# Patient Record
Sex: Female | Born: 1995 | Race: Black or African American | Hispanic: No | Marital: Single | Smoking: Current every day smoker
Health system: Southern US, Community
[De-identification: ages and names within clinical notes are randomized; demographics above are authoritative.]

## PROBLEM LIST (undated history)

## (undated) DIAGNOSIS — K219 Gastro-esophageal reflux disease without esophagitis: Secondary | ICD-10-CM

## (undated) DIAGNOSIS — K2970 Gastritis, unspecified, without bleeding: Secondary | ICD-10-CM

---
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
## 2013-01-01 ENCOUNTER — Emergency Department (HOSPITAL_COMMUNITY)
Admission: EM | Admit: 2013-01-01 | Discharge: 2013-01-01 | Disposition: A | Discharge status: Home / Self Care | Payer: BC Managed Care – PPO | Attending: Emergency Medicine | Admitting: Emergency Medicine

## 2013-01-01 ENCOUNTER — Emergency Department (HOSPITAL_COMMUNITY): Payer: BC Managed Care – PPO

## 2013-01-01 ENCOUNTER — Encounter (HOSPITAL_COMMUNITY): Payer: Self-pay | Admitting: Emergency Medicine

## 2013-01-01 DIAGNOSIS — K219 Gastro-esophageal reflux disease without esophagitis: Secondary | ICD-10-CM | POA: Insufficient documentation

## 2013-01-01 DIAGNOSIS — N83209 Unspecified ovarian cyst, unspecified side: Secondary | ICD-10-CM | POA: Insufficient documentation

## 2013-01-01 DIAGNOSIS — Z79899 Other long term (current) drug therapy: Secondary | ICD-10-CM | POA: Insufficient documentation

## 2013-01-01 DIAGNOSIS — K5900 Constipation, unspecified: Secondary | ICD-10-CM | POA: Insufficient documentation

## 2013-01-01 LAB — CBC WITH DIFFERENTIAL/PLATELET
Basophils Relative: 0 % (ref 0–1)
Eosinophils Absolute: 0 10*3/uL (ref 0.0–0.7)
Eosinophils Relative: 0 % (ref 0–5)
HCT: 42.1 % (ref 36.0–46.0)
Hemoglobin: 14.3 g/dL (ref 12.0–15.0)
Lymphocytes Relative: 14 % (ref 12–46)
Lymphs Abs: 1.2 10*3/uL (ref 0.7–4.0)
Monocytes Absolute: 0.5 10*3/uL (ref 0.1–1.0)
Monocytes Relative: 6 % (ref 3–12)
Neutro Abs: 7 10*3/uL (ref 1.7–7.7)
WBC: 8.8 10*3/uL (ref 4.0–10.5)

## 2013-01-01 LAB — POCT I-STAT, CHEM 8
BUN: 4 mg/dL — ABNORMAL LOW (ref 6–23)
Calcium, Ion: 1.07 mmol/L — ABNORMAL LOW (ref 1.12–1.23)
Creatinine, Ser: 0.7 mg/dL (ref 0.50–1.10)
HCT: 45 % (ref 36.0–46.0)
Hemoglobin: 15.3 g/dL — ABNORMAL HIGH (ref 12.0–15.0)
Potassium: 3.4 mEq/L — ABNORMAL LOW (ref 3.5–5.1)
Sodium: 135 mEq/L (ref 135–145)
TCO2: 22 mmol/L (ref 0–100)

## 2013-01-01 MED ORDER — IOHEXOL 300 MG/ML  SOLN
80.0000 mL | Freq: Once | INTRAMUSCULAR | Status: AC | PRN
  Administered 2013-01-01: 80 mL via INTRAVENOUS

## 2013-01-01 MED ORDER — GI COCKTAIL ~~LOC~~
30.0000 mL | Freq: Once | ORAL | Status: AC
  Administered 2013-01-01: 30 mL via ORAL
  Filled 2013-01-01: qty 30

## 2013-01-01 MED ORDER — ONDANSETRON HCL 4 MG/2ML IJ SOLN
4.0000 mg | Freq: Once | INTRAMUSCULAR | Status: AC
  Administered 2013-01-01: 4 mg via INTRAVENOUS

## 2013-01-01 MED ORDER — KETOROLAC TROMETHAMINE 30 MG/ML IJ SOLN
30.0000 mg | Freq: Once | INTRAMUSCULAR | Status: AC
  Administered 2013-01-01: 30 mg via INTRAVENOUS
  Filled 2013-01-01: qty 1

## 2013-01-01 MED ORDER — IOHEXOL 300 MG/ML  SOLN
50.0000 mL | Freq: Once | INTRAMUSCULAR | Status: AC | PRN
  Administered 2013-01-01: 50 mL via ORAL

## 2013-01-01 MED ORDER — SODIUM CHLORIDE 0.9 % IV SOLN
Freq: Once | INTRAVENOUS | Status: AC
  Administered 2013-01-01: 06:00:00 via INTRAVENOUS

## 2013-01-01 MED ORDER — ONDANSETRON HCL 4 MG/2ML IJ SOLN
INTRAMUSCULAR | Status: AC
  Filled 2013-01-01: qty 2

## 2013-01-01 NOTE — ED Provider Notes (Signed)
CSN: [id number]     Arrival date & time 01/01/13  0436 History   First MD Initiated Contact with Patient 01/01/13 0518     Chief Complaint  Patient presents with  . Abdominal Pain   (Consider location/radiation/quality/duration/timing/severity/associated sxs/prior Treatment) HPI Comments: This is the 3rd ED visit for this patient with the same complaint of epigastric, pain.  She's been evaluated twice with labs, which were, normal.  She's been treated for gastritis started on Protonix and Vicodin, with no relief of her discomfort.  Now.  She states she is constipated, as well.  Has not tried any treatment for her constipation She states she just can't get comfortable and rest.  Denies fever, dysuria, vaginal discharge, vomiting  Patient is a 17 y.o. female presenting with abdominal pain. The history is provided by the patient.  Abdominal Pain Pain location:  Epigastric Pain quality: aching and gnawing   Pain radiates to:  Does not radiate Pain severity:  Severe Onset quality:  Unable to specify Duration:  4 days Timing:  Constant Chronicity:  New Associated symptoms: constipation   Associated symptoms: no chest pain, no chills, no diarrhea, no dysuria, no fever, no nausea, no shortness of breath and no vomiting     History reviewed. No pertinent past medical history. History reviewed. No pertinent past surgical history. History reviewed. No pertinent family history. History  Substance Use Topics  . Smoking status: Never Smoker   . Smokeless tobacco: Not on file  . Alcohol Use: No   OB History   Grav Para Term Preterm Abortions TAB SAB Ect Mult Living                 Review of Systems  Constitutional: Negative for fever and chills.  Respiratory: Negative for shortness of breath.   Cardiovascular: Negative for chest pain.  Gastrointestinal: Positive for abdominal pain and constipation. Negative for nausea, vomiting and diarrhea.  Genitourinary: Negative for dysuria.  Skin:  Negative for rash.  All other systems reviewed and are negative.    Allergies  Review of patient's allergies indicates no known allergies.  Home Medications   Current Outpatient Rx  Name  Route  Sig  Dispense  Refill  . HYDROcodone-acetaminophen (NORCO/VICODIN) 5-325 MG per tablet   Oral   Take 1-2 tablets by mouth every 6 (six) hours as needed for severe pain.   6 tablet   0   . ondansetron (ZOFRAN-ODT) 4 MG disintegrating tablet   Oral   Take 4 mg by mouth every 8 (eight) hours as needed for nausea or vomiting.         . pantoprazole (PROTONIX) 20 MG tablet   Oral   Take 1 tablet (20 mg total) by mouth daily.   10 tablet   0   . naproxen sodium (ANAPROX) 220 MG tablet   Oral   Take 220-440 mg by mouth 2 (two) times daily as needed (for pain).          BP 131/90  Pulse 91  Temp(Src) 98.8 F (37.1 C) (Oral)  Resp 18  Ht 5\' 4"  (1.626 m)  Wt 107 lb 9.6 oz (48.807 kg)  BMI 18.46 kg/m2  SpO2 100%  LMP 12/13/2012 Physical Exam  Nursing note and vitals reviewed. Constitutional: She is oriented to person, place, and time. She appears well-developed and well-nourished.  HENT:  Head: Normocephalic.  Eyes: Pupils are equal, round, and reactive to light.  Neck: Normal range of motion.  Cardiovascular: Normal rate and regular  rhythm.   Pulmonary/Chest: Effort normal.  Abdominal: Soft. Bowel sounds are normal. She exhibits no distension. There is no splenomegaly or hepatomegaly. There is tenderness in the epigastric area. There is no rigidity.  Musculoskeletal: Normal range of motion.  Neurological: She is alert and oriented to person, place, and time.  Skin: Skin is warm. No rash noted.    ED Course  Procedures (including critical care time) Labs Review Labs Reviewed  CBC WITH DIFFERENTIAL   Imaging Review No results found.  EKG Interpretation   None       MDM  No diagnosis found.      Arman Filter, NP 01/01/13 [phone]

## 2013-01-01 NOTE — ED Provider Notes (Signed)
Medical screening examination/treatment/procedure(s) were performed by non-physician practitioner and as supervising physician I was immediately available for consultation/collaboration.  EKG Interpretation   None         James Lafalce L Shylo Dillenbeck, MD 01/01/13 0607 

## 2013-01-01 NOTE — ED Notes (Signed)
Pt arrived to the Ed with a complaint of abdominal pain.  Pt has been seen for this two previous times but has not gotten relief from the pain.  Pt states her last bowel movement was this past Sunday and that it was not large.  Pt states pain is located at the top of her stomach.  Pt states that the pain is tight balling sensation.

## 2013-01-01 NOTE — ED Notes (Addendum)
Attempting to get vital signs for discharge and mother of patient demanding to talk to doctors because is not in agreeance of PA diagnosis. PA Robyn notified; MD Silverio Lay at bedside now. RN Ajsa aware. Charge RN [employer] aware as well.

## 2013-01-01 NOTE — ED Provider Notes (Signed)
Care assumed from [place], NP at shift change. Pt with epigastric pain, seen twice (12/21 and 12/23) and diagnosed with gastritis. CT abdomen pending.  8:31 AM CT scan showing evidence of recent ruptured ovarian cyst. Kayla Hubbard questioning why she continues to burp and have epigastric pain. I advised outpatient GI followup. Denies vaginal bleeding or discharge. Advised followup with gynecology as well regarding ovarian cyst. Return precautions given. Kayla Hubbard states understanding of treatment care plan and is agreeable.  US Abdomen Complete  12/29/2012   CLINICAL DATA:  Abdominal pain  EXAM: ULTRASOUND ABDOMEN COMPLETE  COMPARISON:  None.  FINDINGS: Gallbladder:  No gallstones or wall thickening visualized. No sonographic Murphy sign noted.  Common bile duct:  Diameter: 3 mm in caliber.  Liver:  No focal lesion identified. Within normal limits in parenchymal echogenicity.  IVC:  No abnormality visualized.  Pancreas:  Visualized portion unremarkable.  Spleen:  Size and appearance within normal limits.  Right Kidney:  Length: 10.1 cm in length. Echogenicity within normal limits. No mass or hydronephrosis visualized.  Left Kidney:  Length: 10.6 cm in length. Echogenicity within normal limits. No mass or hydronephrosis visualized.  Abdominal aorta:  No aneurysm visualized.  Other findings:  None.  IMPRESSION: No acute intra-abdominal pathology.   Electronically Signed   By: Maryclare Bean M.D.   On: 12/29/2012 13:03   Ct Abdomen Pelvis W Contrast  01/01/2013   CLINICAL DATA:  Abdominal pain  EXAM: CT ABDOMEN AND PELVIS WITH CONTRAST  TECHNIQUE: Multidetector CT imaging of the abdomen and pelvis was performed using the standard protocol following bolus administration of intravenous contrast. Oral contrast was also administered.  CONTRAST:  80mL OMNIPAQUE IOHEXOL 300 MG/ML  SOLN  COMPARISON:  Abdominal ultrasound December 29, 2012  FINDINGS: Lung bases are clear.  No focal liver lesions are identified. There is no  biliary duct dilatation. Gallbladder wall is not thickened.  Spleen, pancreas, and adrenals appear normal. Kidneys bilaterally show no appreciable mass or hydronephrosis on either side. There is no renal or ureteral calculus on either side.  In the pelvis, there is a small amount of free fluid in the cul-de-sac. There is also some fluid immediately adjacent to the right ovary. There is mild enhancement of the wall of the a 1.7 x 1.3 cm right ovarian cyst. There is no other evidence of pelvic mass. Urinary bladder is midline with normal wall thickness.  Appendix appears normal.  There is no bowel obstruction.  No free air or portal venous air.  There is no adenopathy or abscess in the abdomen or pelvis. Aorta is nonaneurysmal. There are no blastic or lytic bone lesions. The right L1 lamina is hypoplastic, an anatomic variant.  IMPRESSION: There is fluid tracking from the right ovary to the cul-de-sac. Suspect recent ovarian cyst rupture. Slight enhancement of the wall of the remaining right ovarian cyst is supportive of this etiology.  Appendix appears normal. No abscess. No bowel obstruction. No renal or ureteral calculus. No hydronephrosis.   Electronically Signed   By: Bretta Bang M.D.   On: 01/01/2013 08:12    Trevor Mace, PA-C 01/01/13 [phone]

## 2013-01-04 ENCOUNTER — Telehealth: Payer: Self-pay | Admitting: Gastroenterology

## 2013-08-03 ENCOUNTER — Other Ambulatory Visit: Payer: Self-pay | Admitting: Gastroenterology

## 2013-08-03 DIAGNOSIS — R1013 Epigastric pain: Secondary | ICD-10-CM

## 2013-08-11 ENCOUNTER — Ambulatory Visit (HOSPITAL_COMMUNITY): Admission: RE | Admit: 2013-08-11 | Payer: BC Managed Care – PPO | Source: Ambulatory Visit

## 2013-09-12 ENCOUNTER — Emergency Department (HOSPITAL_COMMUNITY)
Admission: EM | Admit: 2013-09-12 | Discharge: 2013-09-12 | Disposition: A | Discharge status: Home / Self Care | Payer: BC Managed Care – HMO | Attending: Emergency Medicine | Admitting: Emergency Medicine

## 2013-09-12 ENCOUNTER — Encounter (HOSPITAL_COMMUNITY): Payer: Self-pay | Admitting: Emergency Medicine

## 2013-09-12 DIAGNOSIS — Z3202 Encounter for pregnancy test, result negative: Secondary | ICD-10-CM | POA: Insufficient documentation

## 2013-09-12 DIAGNOSIS — R112 Nausea with vomiting, unspecified: Secondary | ICD-10-CM | POA: Diagnosis not present

## 2013-09-12 DIAGNOSIS — R109 Unspecified abdominal pain: Secondary | ICD-10-CM | POA: Insufficient documentation

## 2013-09-12 DIAGNOSIS — K2970 Gastritis, unspecified, without bleeding: Secondary | ICD-10-CM | POA: Diagnosis not present

## 2013-09-12 DIAGNOSIS — K921 Melena: Secondary | ICD-10-CM | POA: Insufficient documentation

## 2013-09-12 DIAGNOSIS — Z79899 Other long term (current) drug therapy: Secondary | ICD-10-CM | POA: Diagnosis not present

## 2013-09-12 DIAGNOSIS — K2990 Gastroduodenitis, unspecified, without bleeding: Principal | ICD-10-CM

## 2013-09-12 LAB — URINALYSIS, ROUTINE W REFLEX MICROSCOPIC
Bilirubin Urine: NEGATIVE
GLUCOSE, UA: NEGATIVE mg/dL
Ketones, ur: NEGATIVE mg/dL
LEUKOCYTES UA: NEGATIVE
Nitrite: NEGATIVE
Protein, ur: NEGATIVE mg/dL
Specific Gravity, Urine: 1.018 (ref 1.005–1.030)
Urobilinogen, UA: 0.2 mg/dL (ref 0.0–1.0)
pH: 8 (ref 5.0–8.0)

## 2013-09-12 LAB — URINE MICROSCOPIC-ADD ON

## 2013-09-12 LAB — CBC WITH DIFFERENTIAL/PLATELET
Basophils Absolute: 0.1 10*3/uL (ref 0.0–0.1)
Basophils Relative: 0 % (ref 0–1)
Eosinophils Absolute: 0 10*3/uL (ref 0.0–0.7)
Eosinophils Relative: 0 % (ref 0–5)
HCT: 40.6 % (ref 36.0–46.0)
HEMOGLOBIN: 13.6 g/dL (ref 12.0–15.0)
LYMPHS ABS: 1.6 10*3/uL (ref 0.7–4.0)
Lymphocytes Relative: 12 % (ref 12–46)
MCH: 28.9 pg (ref 26.0–34.0)
MCHC: 33.5 g/dL (ref 30.0–36.0)
MCV: 86.4 fL (ref 78.0–100.0)
MONOS PCT: 3 % (ref 3–12)
Monocytes Absolute: 0.4 10*3/uL (ref 0.1–1.0)
NEUTROS ABS: 11.1 10*3/uL — AB (ref 1.7–7.7)
NEUTROS PCT: 85 % — AB (ref 43–77)
Platelets: 310 10*3/uL (ref 150–400)
RBC: 4.7 MIL/uL (ref 3.87–5.11)
RDW: 12.7 % (ref 11.5–15.5)
WBC: 13.1 10*3/uL — AB (ref 4.0–10.5)

## 2013-09-12 LAB — COMPREHENSIVE METABOLIC PANEL
ALT: 17 U/L (ref 0–35)
AST: 25 U/L (ref 0–37)
Albumin: 4.5 g/dL (ref 3.5–5.2)
Alkaline Phosphatase: 47 U/L (ref 39–117)
Anion gap: 15 (ref 5–15)
BILIRUBIN TOTAL: 0.5 mg/dL (ref 0.3–1.2)
BUN: 8 mg/dL (ref 6–23)
CHLORIDE: 99 meq/L (ref 96–112)
CO2: 25 mEq/L (ref 19–32)
Calcium: 9.6 mg/dL (ref 8.4–10.5)
Creatinine, Ser: 0.65 mg/dL (ref 0.50–1.10)
GFR calc Af Amer: >90 mL/min (ref >90)
GFR calc non Af Amer: >90 mL/min (ref >90)
GLUCOSE: 121 mg/dL — AB (ref 70–99)
POTASSIUM: 3.8 meq/L (ref 3.7–5.3)
Sodium: 139 mEq/L (ref 137–147)
TOTAL PROTEIN: 8.1 g/dL (ref 6.0–8.3)

## 2013-09-12 LAB — POC URINE PREG, ED: Preg Test, Ur: NEGATIVE

## 2013-09-12 LAB — LIPASE, BLOOD: Lipase: 25 U/L (ref 11–59)

## 2013-09-12 MED ORDER — ONDANSETRON 4 MG PO TBDP: 4.0000 mg | ORAL_TABLET | Freq: Three times a day (TID) | ORAL | Status: DC | PRN

## 2013-09-12 MED ORDER — FAMOTIDINE 20 MG PO TABS
40.0000 mg | ORAL_TABLET | Freq: Once | ORAL | Status: DC
  Filled 2013-09-12: qty 2

## 2013-09-12 MED ORDER — PANTOPRAZOLE SODIUM 40 MG PO TBEC
40.0000 mg | DELAYED_RELEASE_TABLET | Freq: Once | ORAL | Status: DC
  Filled 2013-09-12: qty 1

## 2013-09-12 MED ORDER — ONDANSETRON HCL 4 MG/2ML IJ SOLN
4.0000 mg | Freq: Once | INTRAMUSCULAR | Status: AC
  Administered 2013-09-12: 4 mg via INTRAVENOUS
  Filled 2013-09-12: qty 2

## 2013-09-12 MED ORDER — PANTOPRAZOLE SODIUM 40 MG IV SOLR
40.0000 mg | Freq: Once | INTRAVENOUS | Status: AC
  Administered 2013-09-12: 40 mg via INTRAVENOUS
  Filled 2013-09-12: qty 40

## 2013-09-12 MED ORDER — FAMOTIDINE IN NACL 20-0.9 MG/50ML-% IV SOLN
20.0000 mg | Freq: Once | INTRAVENOUS | Status: AC
  Administered 2013-09-12: 20 mg via INTRAVENOUS
  Filled 2013-09-12: qty 50

## 2013-09-12 MED ORDER — SODIUM CHLORIDE 0.9 % IV BOLUS (SEPSIS)
1000.0000 mL | Freq: Once | INTRAVENOUS | Status: AC
  Administered 2013-09-12: 1000 mL via INTRAVENOUS

## 2013-09-12 MED ORDER — HYDROMORPHONE HCL PF 1 MG/ML IJ SOLN
1.0000 mg | Freq: Once | INTRAMUSCULAR | Status: AC
  Administered 2013-09-12: 1 mg via INTRAVENOUS
  Filled 2013-09-12: qty 1

## 2013-09-12 MED ORDER — FAMOTIDINE 20 MG PO TABS: 20.0000 mg | ORAL_TABLET | Freq: Two times a day (BID) | ORAL | Status: DC

## 2013-09-12 NOTE — ED Notes (Signed)
Pt states that she drank too much last night and has been vomiting since this morning.  States that she saw BRB in her stool this morning.  Pt vomiting in triage.

## 2013-09-12 NOTE — Discharge Instructions (Signed)
Please read and follow all provided instructions.  Your diagnoses today include:  1. Gastritis   2. Non-intractable vomiting with nausea, vomiting of unspecified type     Tests performed today include:  Blood counts and electrolytes  Blood tests to check liver and kidney function  Blood tests to check pancreas function  Urine test to look for infection and pregnancy (in women)  Vital signs. See below for your results today.   Medications prescribed:   Pepcid (famotidine) - antihistamine  You can find this medication over-the-counter.   DO NOT exceed:    Pepcid every 12 hours   Zofran (ondansetron) - for nausea and vomiting  Take any prescribed medications only as directed.  Home care instructions:   Follow any educational materials contained in this packet.   Your abdominal pain, nausea, vomiting may be caused by gastritis. You should rest for the next several days. Keep drinking plenty of fluids and use the medicine for nausea as directed.    Drink clear liquids for the next 24 hours and introduce solid foods slowly after 24 hours using the b.r.a.t. diet (Bananas, Rice, Applesauce, Toast, Yogurt).    Follow-up instructions: Please follow-up with your primary care provider in the next 2 days for further evaluation of your symptoms. If you are not feeling better in 48 hours you may have a condition that is more serious and you need re-evaluation.   Return instructions:  SEEK IMMEDIATE MEDICAL ATTENTION IF:  If you have pain that does not go away or becomes severe   A temperature above 101F develops   Repeated vomiting occurs (multiple episodes)   If you have pain that becomes localized to portions of the abdomen. The right side could possibly be appendicitis. In an adult, the left lower portion of the abdomen could be colitis or diverticulitis.   Blood is being passed in stools or vomit (bright red or black tarry stools)   You develop chest pain,  difficulty breathing, dizziness or fainting, or become confused, poorly responsive, or inconsolable (young children)  If you have any other emergent concerns regarding your health  Additional Information: Abdominal (belly) pain can be caused by many things. Your caregiver performed an examination and possibly ordered blood/urine tests and imaging (CT scan, x-rays, ultrasound). Many cases can be observed and treated at home after initial evaluation in the emergency department. Even though you are being discharged home, abdominal pain can be unpredictable. Therefore, you need a repeated exam if your pain does not resolve, returns, or worsens. Most patients with abdominal pain don't have to be admitted to the hospital or have surgery, but serious problems like appendicitis and gallbladder attacks can start out as nonspecific pain. Many abdominal conditions cannot be diagnosed in one visit, so follow-up evaluations are very important.  Your vital signs today were: BP 119/72   Pulse 96   Temp(Src) 97.9 F (36.6 C) (Oral)   Resp 18   Wt 112 lb (50.803 kg)   SpO2 100%   LMP 09/05/2013 If your blood pressure (bp) was elevated above 135/85 this visit, please have this repeated by your doctor within one month. --------------

## 2013-09-12 NOTE — ED Provider Notes (Signed)
CSN: [id number]     Arrival date & time 09/12/13  1427 History   First MD Initiated Contact with Patient 09/12/13 1534     Chief Complaint  Patient presents with  . Abdominal Pain  . Nausea  . Emesis     (Consider location/radiation/quality/duration/timing/severity/associated sxs/prior Treatment) HPI Comments: Patient with no past surgical history presents with complaint of upper abdominal pain, nausea, vomiting since 2 AM this morning. Pt also states that she had a BM with bright red blood this morning. She has had this before but denies history of hemorrhoids. Patient was drinking alcohol heavily and thinks this caused her symptoms. No fever, CP, SOB, hematuria or dysuria. No easily bruising or bleeding. No blood thinners. Occasional use of naproxen but not in excess and not in the past week. No h/o PUD/GERD. Patient was given Mylanta by her mother this AM but vomited shortly afterwards. No other treatments PTA. The onset of this condition was acute. The course is constant. Aggravating factors: none. Alleviating factors: none.    Patient is a 18 y.o. female presenting with abdominal pain and vomiting. The history is provided by the patient.  Abdominal Pain Associated symptoms: nausea and vomiting   Associated symptoms: no chest pain, no constipation, no cough, no diarrhea, no dysuria, no fever and no sore throat   Emesis Associated symptoms: abdominal pain   Associated symptoms: no diarrhea, no headaches, no myalgias and no sore throat     History reviewed. No pertinent past medical history. History reviewed. No pertinent past surgical history. History reviewed. No pertinent family history. History  Substance Use Topics  . Smoking status: Never Smoker   . Smokeless tobacco: Not on file  . Alcohol Use: No   OB History   Grav Para Term Preterm Abortions TAB SAB Ect Mult Living                 Review of Systems  Constitutional: Negative for fever.  HENT: Negative for rhinorrhea  and sore throat.   Eyes: Negative for redness.  Respiratory: Negative for cough.   Cardiovascular: Negative for chest pain.  Gastrointestinal: Positive for nausea, vomiting, abdominal pain and blood in stool. Negative for diarrhea and constipation.  Genitourinary: Negative for dysuria.  Musculoskeletal: Negative for myalgias.  Skin: Negative for rash.  Neurological: Negative for headaches.      Allergies  Review of patient's allergies indicates no known allergies.  Home Medications   Prior to Admission medications   Medication Sig Start Date End Date Taking? Authorizing Provider  Multiple Vitamin (MULTIVITAMIN WITH MINERALS) TABS tablet Take 1 tablet by mouth daily.   Yes Historical Provider, MD  naproxen sodium (ANAPROX) 220 MG tablet Take 220-440 mg by mouth 2 (two) times daily as needed (for pain).   Yes Historical Provider, MD   BP 119/79  Pulse 76  Temp(Src) 97.9 F (36.6 C) (Oral)  Resp 20  Wt 112 lb (50.803 kg)  SpO2 100%  LMP 09/05/2013 Physical Exam  Nursing note and vitals reviewed. Constitutional: She appears well-developed and well-nourished.  HENT:  Head: Normocephalic and atraumatic.  Mouth/Throat: Oropharynx is clear and moist.  No intraoral lesions.   Eyes: Conjunctivae are normal. Right eye exhibits no discharge. Left eye exhibits no discharge.  Neck: Normal range of motion. Neck supple.  Cardiovascular: Normal rate, regular rhythm and normal heart sounds.   No murmur heard. Pulmonary/Chest: Effort normal and breath sounds normal. No respiratory distress. She has no wheezes. She has no rales.  Abdominal:  Soft. Bowel sounds are normal. She exhibits no distension. There is tenderness in the right upper quadrant, epigastric area and left upper quadrant. There is no rebound and no guarding.  Neurological: She is alert.  Skin: Skin is warm and dry.  No bruising.  Psychiatric: She has a normal mood and affect.    ED Course  Procedures (including critical  care time) Labs Review Labs Reviewed  CBC WITH DIFFERENTIAL - Abnormal; Notable for the following:    WBC 13.1 (*)    Neutrophils Relative % 85 (*)    Neutro Abs 11.1 (*)    All other components within normal limits  COMPREHENSIVE METABOLIC PANEL - Abnormal; Notable for the following:    Glucose, Bld 121 (*)    All other components within normal limits  URINALYSIS, ROUTINE W REFLEX MICROSCOPIC - Abnormal; Notable for the following:    APPearance TURBID (*)    Hgb urine dipstick TRACE (*)    All other components within normal limits  LIPASE, BLOOD  URINE MICROSCOPIC-ADD ON  POC URINE PREG, ED    Imaging Review No results found.   EKG Interpretation None      3:54 PM Patient seen and examined. Work-up initiated. Medications ordered.   Vital signs reviewed and are as follows: BP 119/79  Pulse 76  Temp(Src) 97.9 F (36.6 C) (Oral)  Resp 20  Wt 112 lb (50.803 kg)  SpO2 100%  LMP 09/05/2013  8:01 PM Pt feels much better with treatment. She has received 2L NS. Abd is currently soft and non-tender on re-exam. Will d/c to home with symptomatic treatment for gastritis. Patient counseled on clear liquid diet for the next 24 hours and brat diet.  The patient was urged to return to the Emergency Department immediately with worsening of current symptoms, worsening abdominal pain, persistent vomiting, blood noted in stools, fever, or any other concerns. The patient verbalized understanding.    MDM   Final diagnoses:  Gastritis  Non-intractable vomiting with nausea, vomiting of unspecified type   Abd pain, N/V: Patient with symptoms consistent with gastritis, likely 2/2 EtOH use. Vitals are stable, no fever. No signs of severe dehydration, tolerating PO's. Lungs are clear. No focal abdominal pain, no concern for appendicitis, cholecystitis, pancreatitis, ruptured viscus, UTI, kidney stone, or any other abdominal etiology. Supportive therapy indicated with return if symptoms worsen.  Patient counseled.  Blood in stool: bright red suggesting lower GI bleed. Hgb/Hct nml. Pt counseled to monitor. F/u with PCP for recheck if persistent. Likely benign etiology. No further eval indicated here tonight.      Renne Crigler, PA-C 09/12/13 2004

## 2013-09-12 NOTE — ED Notes (Signed)
Patient unable to void 

## 2013-09-12 NOTE — ED Provider Notes (Signed)
Medical screening examination/treatment/procedure(s) were performed by non-physician practitioner and as supervising physician I was immediately available for consultation/collaboration.   EKG Interpretation None      Devoria Albe, MD, Armando Gang   Ward Givens, MD 09/12/13 2012

## 2013-09-12 NOTE — ED Notes (Signed)
Pt unsuccessful in giving UA sample. Will try again

## 2014-01-12 ENCOUNTER — Emergency Department (HOSPITAL_COMMUNITY)
Admission: EM | Admit: 2014-01-12 | Discharge: 2014-01-12 | Disposition: A | Discharge status: Home / Self Care | Payer: BLUE CROSS/BLUE SHIELD | Attending: Emergency Medicine | Admitting: Emergency Medicine

## 2014-01-12 ENCOUNTER — Encounter (HOSPITAL_COMMUNITY): Payer: Self-pay | Admitting: Emergency Medicine

## 2014-01-12 DIAGNOSIS — R1013 Epigastric pain: Secondary | ICD-10-CM | POA: Diagnosis present

## 2014-01-12 DIAGNOSIS — K2970 Gastritis, unspecified, without bleeding: Secondary | ICD-10-CM | POA: Insufficient documentation

## 2014-01-12 HISTORY — DX: Gastritis, unspecified, without bleeding: K29.70

## 2014-01-12 MED ORDER — OMEPRAZOLE 20 MG PO CPDR: 20.0000 mg | DELAYED_RELEASE_CAPSULE | Freq: Every day | ORAL | Status: DC

## 2014-01-12 MED ORDER — ONDANSETRON 4 MG PO TBDP
4.0000 mg | ORAL_TABLET | Freq: Once | ORAL | Status: AC
  Administered 2014-01-12: 4 mg via ORAL
  Filled 2014-01-12: qty 1

## 2014-01-12 MED ORDER — GI COCKTAIL ~~LOC~~
30.0000 mL | Freq: Once | ORAL | Status: AC
  Administered 2014-01-12: 30 mL via ORAL
  Filled 2014-01-12: qty 30

## 2014-01-12 MED ORDER — PANTOPRAZOLE SODIUM 40 MG PO TBEC
40.0000 mg | DELAYED_RELEASE_TABLET | Freq: Once | ORAL | Status: AC
  Administered 2014-01-12: 40 mg via ORAL
  Filled 2014-01-12: qty 1

## 2014-01-12 MED ORDER — PROMETHAZINE HCL 25 MG PO TABS: 25.0000 mg | ORAL_TABLET | Freq: Four times a day (QID) | ORAL | Status: DC | PRN

## 2014-01-12 NOTE — Discharge Instructions (Signed)
Gastritis, Adult Gastritis is soreness and swelling (inflammation) of the lining of the stomach. Gastritis can develop as a sudden onset (acute) or long-term (chronic) condition. If gastritis is not treated, it can lead to stomach bleeding and ulcers. CAUSES  Gastritis occurs when the stomach lining is weak or damaged. Digestive juices from the stomach then inflame the weakened stomach lining. The stomach lining may be weak or damaged due to viral or bacterial infections. One common bacterial infection is the Helicobacter pylori infection. Gastritis can also result from excessive alcohol consumption, taking certain medicines, or having too much acid in the stomach.  SYMPTOMS  In some cases, there are no symptoms. When symptoms are present, they may include:  Pain or a burning sensation in the upper abdomen.  Nausea.  Vomiting.  An uncomfortable feeling of fullness after eating. DIAGNOSIS  Your caregiver may suspect you have gastritis based on your symptoms and a physical exam. To determine the cause of your gastritis, your caregiver may perform the following:  Blood or stool tests to check for the H pylori bacterium.  Gastroscopy. A thin, flexible tube (endoscope) is passed down the esophagus and into the stomach. The endoscope has a light and camera on the end. Your caregiver uses the endoscope to view the inside of the stomach.  Taking a tissue sample (biopsy) from the stomach to examine under a microscope. TREATMENT  Depending on the cause of your gastritis, medicines may be prescribed. If you have a bacterial infection, such as an H pylori infection, antibiotics may be given. If your gastritis is caused by too much acid in the stomach, H2 blockers or antacids may be given. Your caregiver may recommend that you stop taking aspirin, ibuprofen, or other nonsteroidal anti-inflammatory drugs (NSAIDs). HOME CARE INSTRUCTIONS  Only take over-the-counter or prescription medicines as directed by  your caregiver.  If you were given antibiotic medicines, take them as directed. Finish them even if you start to feel better.  Drink enough fluids to keep your urine clear or pale yellow.  Avoid foods and drinks that make your symptoms worse, such as:  Caffeine or alcoholic drinks.  Chocolate.  Peppermint or mint flavorings.  Garlic and onions.  Spicy foods.  Citrus fruits, such as oranges, lemons, or limes.  Tomato-based foods such as sauce, chili, salsa, and pizza.  Fried and fatty foods.  Eat small, frequent meals instead of large meals. SEEK IMMEDIATE MEDICAL CARE IF:   You have black or dark red stools.  You vomit blood or material that looks like coffee grounds.  You are unable to keep fluids down.  Your abdominal pain gets worse.  You have a fever.  You do not feel better after 1 week.  You have any other questions or concerns. MAKE SURE YOU:  Understand these instructions.  Will watch your condition.  Will get help right away if you are not doing well or get worse. Document Released: 12/18/2000 Document Revised: 06/25/2011 Document Reviewed: 02/06/2011 [facility] Patient Information 2015 [place], [place]. This information is not intended to replace advice given to you by your health care provider. Make sure you discuss any questions you have with your health care provider.    Food Choices for Peptic Ulcer Disease, Gastritis When you have peptic ulcer disease, the foods you eat and your eating habits are very important. Choosing the right foods can help ease the discomfort of peptic ulcer disease. WHAT GENERAL GUIDELINES DO I NEED TO FOLLOW?  Choose fruits, vegetables, whole grains, and  low-fat meat, fish, and poultry.   Keep a food diary to identify foods that cause symptoms.  Avoid foods that cause irritation or pain. These may be different for different people.  Eat frequent small meals instead of three large meals each day. The pain may be worse  when your stomach is empty.  Avoid eating close to bedtime. WHAT FOODS ARE NOT RECOMMENDED? The following are some foods and drinks that may worsen your symptoms:  Black, white, and red pepper.  Hot sauce.  Chili peppers.  Chili powder.  Chocolate and cocoa.   Alcohol.  Tea, coffee, and cola (regular and decaffeinated). The items listed above may not be a complete list of foods and beverages to avoid. Contact your dietitian for more information. Document Released: 03/18/2011 Document Revised: 12/29/2012 Document Reviewed: 10/28/2012 [facility] Patient Information 2015 [place]. This information is not intended to replace advice given to you by your health care provider. Make sure you discuss any questions you have with your health care provider.

## 2014-01-12 NOTE — ED Provider Notes (Signed)
TIME SEEN: 11:30 AM  CHIEF COMPLAINT: Abdominal pain  HPI: Pt is a 19 y.o. female with history of gastritis who presents emergency department with an exacerbation of her gastritis. Reports that she had alcohol last night which normally exacerbates her symptoms and today she woke up with crampy abdominal pain and epigastric region and multiple episodes of nonbloody, nonbilious vomiting. No diarrhea. States she has been constipated. No fevers or chills. No sick contacts or recent travel. No bloody stool or melena.  ROS: See HPI Constitutional: no fever  Eyes: no drainage  ENT: no runny nose   Cardiovascular:  no chest pain  Resp: no SOB  GI: vomiting GU: no dysuria Integumentary: no rash  Allergy: no hives  Musculoskeletal: no leg swelling  Neurological: no slurred speech ROS otherwise negative  PAST MEDICAL HISTORY/PAST SURGICAL HISTORY:  Past Medical History  Diagnosis Date  . Gastritis     MEDICATIONS:  Prior to Admission medications   Medication Sig Start Date End Date Taking? Authorizing Provider  famotidine (PEPCID) 20 MG tablet Take 1 tablet (20 mg total) by mouth 2 (two) times daily. Patient not taking: Reported on 01/12/2014 09/12/13   Renne CriglerJoshua Geiple, PA-C  ondansetron (ZOFRAN ODT) 4 MG disintegrating tablet Take 1 tablet (4 mg total) by mouth every 8 (eight) hours as needed for nausea or vomiting. Patient not taking: Reported on 01/12/2014 09/12/13   Renne CriglerJoshua Geiple, PA-C    ALLERGIES:  No Known Allergies  SOCIAL HISTORY:  History  Substance Use Topics  . Smoking status: Never Smoker   . Smokeless tobacco: Not on file  . Alcohol Use: No    FAMILY HISTORY: No family history on file.  EXAM: BP 121/70 mmHg  Pulse 80  Temp(Src) 98.7 F (37.1 C) (Oral)  Resp 20  SpO2 100%  LMP 01/02/2014 (Exact Date) CONSTITUTIONAL: Alert and oriented and responds appropriately to questions. Well-appearing; well-nourished HEAD: Normocephalic EYES: Conjunctivae clear, PERRL ENT:  normal nose; no rhinorrhea; moist mucous membranes; pharynx without lesions noted NECK: Supple, no meningismus, no LAD  CARD: RRR; S1 and S2 appreciated; no murmurs, no clicks, no rubs, no gallops RESP: Normal chest excursion without splinting or tachypnea; breath sounds clear and equal bilaterally; no wheezes, no rhonchi, no rales,  ABD/GI: Normal bowel sounds; non-distended; soft, mildly tender to palpation in the epigastric region, no tenderness in the right upper quadrant, negative Murphy sign, no peritoneal signs, no rebound or guarding, nonsurgical abdomen BACK:  The back appears normal and is non-tender to palpation, there is no CVA tenderness EXT: Normal ROM in all joints; non-tender to palpation; no edema; normal capillary refill; no cyanosis    SKIN: Normal color for age and race; warm NEURO: Moves all extremities equally PSYCH: The patient's mood and manner are appropriate. Grooming and personal hygiene are appropriate.  MEDICAL DECISION MAKING: Patient here with epigastric pain similar to her prior episodes of gastritis likely flared from alcohol use last night. She did vomit several times this morning. She is very well-appearing, appears well-hydrated, hemodynamically stable with a benign abdominal exam. Will treat symptomatically with protonix, GI cocktail, Zofran and by mouth challenge.  ED PROGRESS: Patient reports symptoms completely gone after GI cocktail. Able to tolerate oral fluids. We'll discharge home on omeprazole and with a prescription for Zofran. Discussed return precautions. Recommended avoiding alcohol. Patient and mother at bedside verbalize understanding and are comfortable with plan.     Layla MawKristen N Ward, DO 01/12/14 1301

## 2014-01-12 NOTE — ED Notes (Signed)
Pt states she had 3 shots of alcohol last night, c/o abd pain this morning along with vomiting. Pt states her body usually does not agree with alcohol.

## 2014-09-14 IMAGING — CT CT ABD-PELV W/ CM
1 of 2 series · 15 of 32 positions shown, 19 images · IV contrast (OMNIPAQUE 300)
Comparison: Abdominal ultrasound December 29, 2012

CLINICAL DATA: Abdominal pain

EXAM:
CT ABDOMEN AND PELVIS WITH CONTRAST
TECHNIQUE: Multidetector CT imaging of the abdomen and pelvis was performed
using the standard protocol following bolus administration of
intravenous contrast. Oral contrast was also administered.
CONTRAST:  80mL OMNIPAQUE IOHEXOL 300 MG/ML  SOLN

[Series 2: abd/pel with · axial · 0.66mm/px · z∈[+1120,+1520]mm · 15 of 88 slices shown, 19 images]
[im 4/88  soft-tissue]
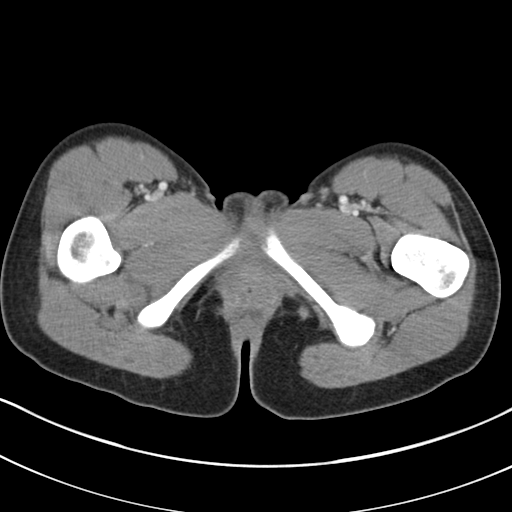
[im 4/88  bone]
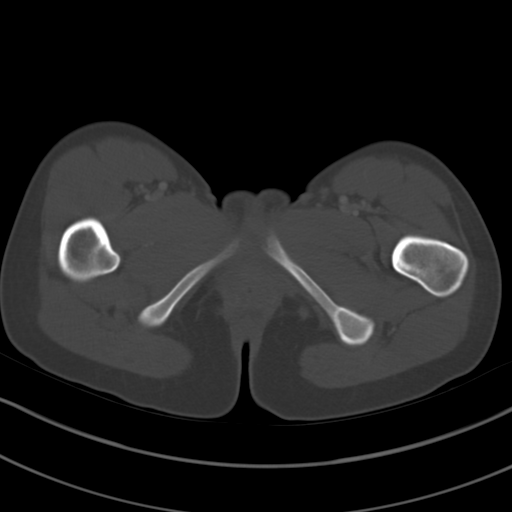
[im 11/88  soft-tissue]
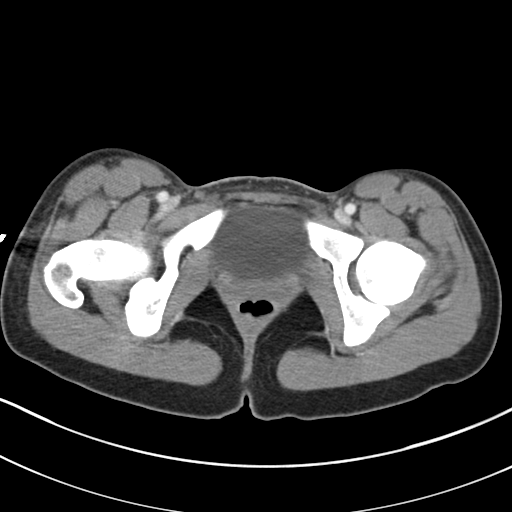
[im 19/88  soft-tissue]
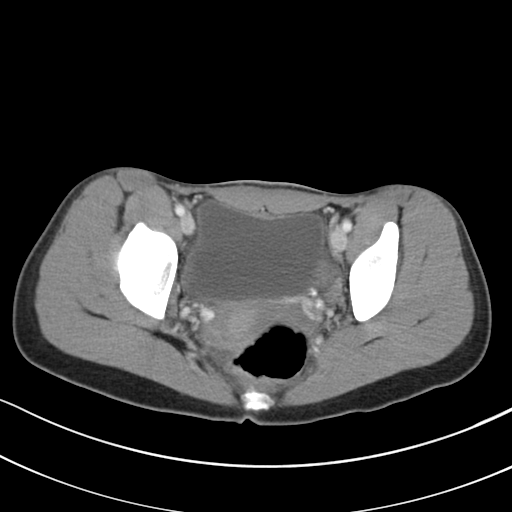
[im 26/88  soft-tissue]
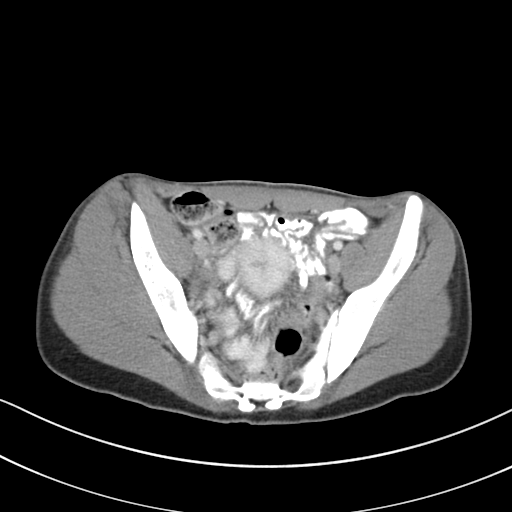
[im 30/88  soft-tissue]
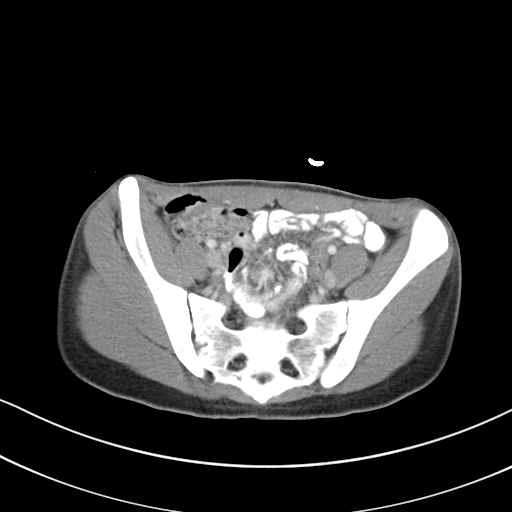
[im 37/88  soft-tissue]
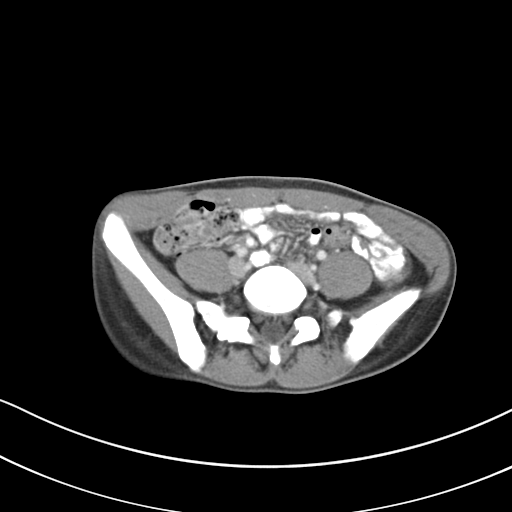
[im 44/88  soft-tissue]
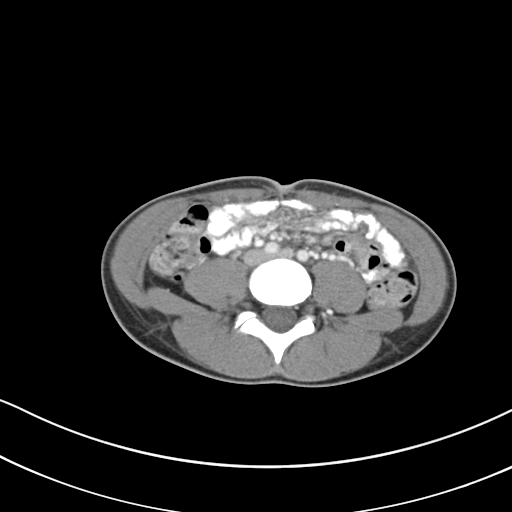
[im 51/88  soft-tissue]
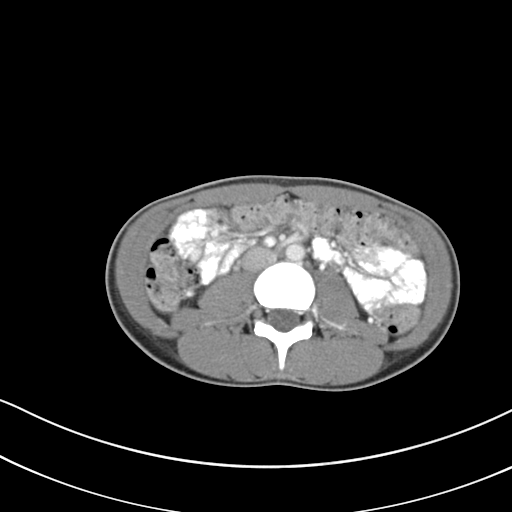
[im 59/88  soft-tissue]
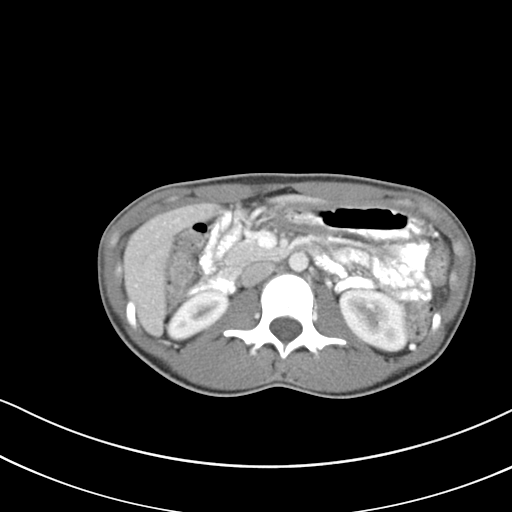
[im 59/88  bone]
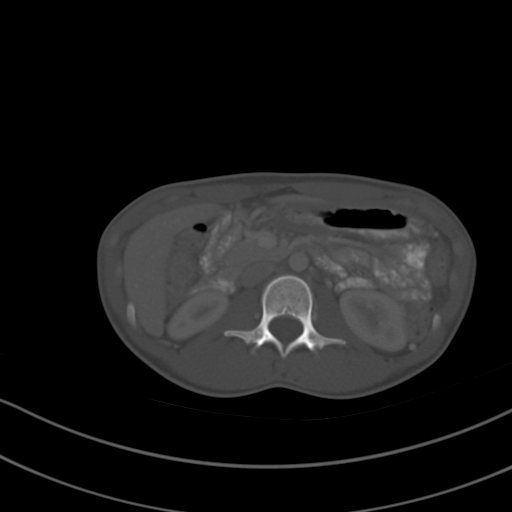
[im 62/88  soft-tissue]
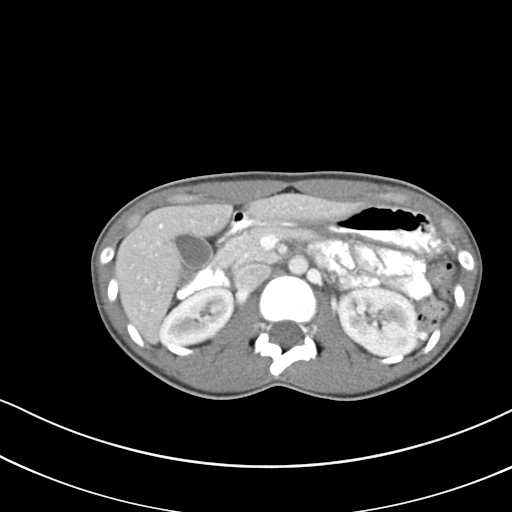
[im 69/88  soft-tissue]
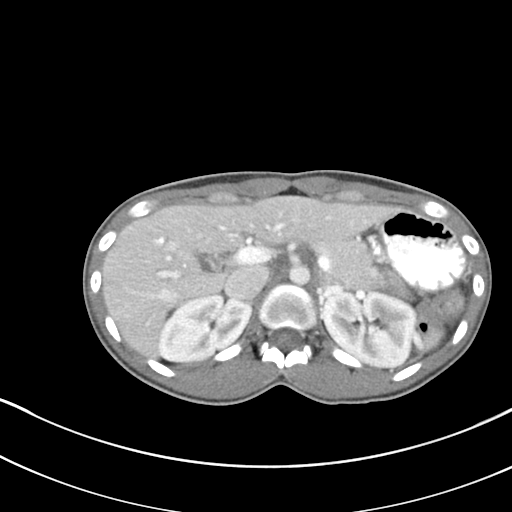
[im 73/88  lung]
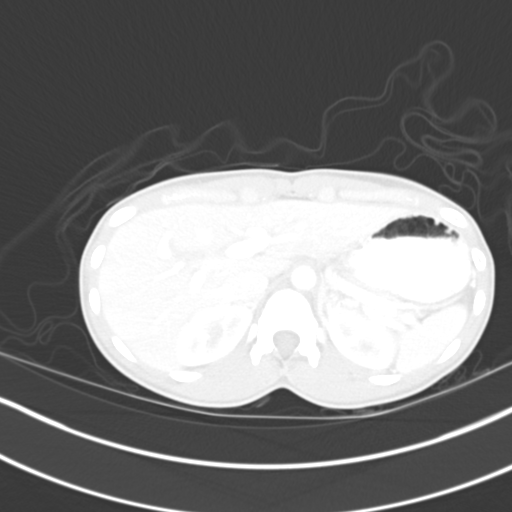
[im 77/88  soft-tissue]
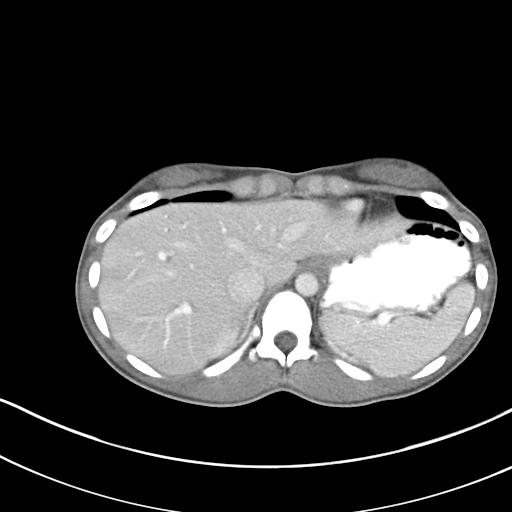
[im 77/88  lung]
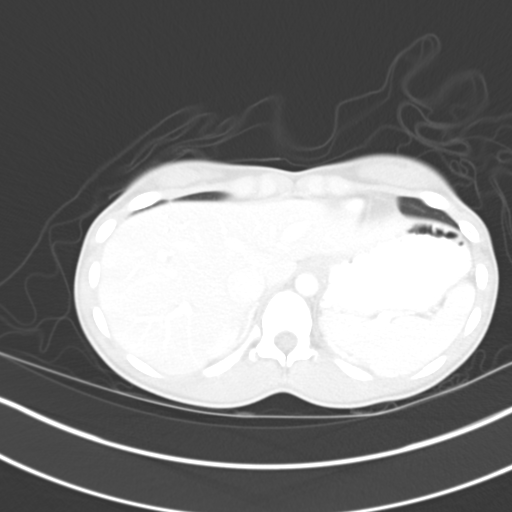
[im 80/88  lung]
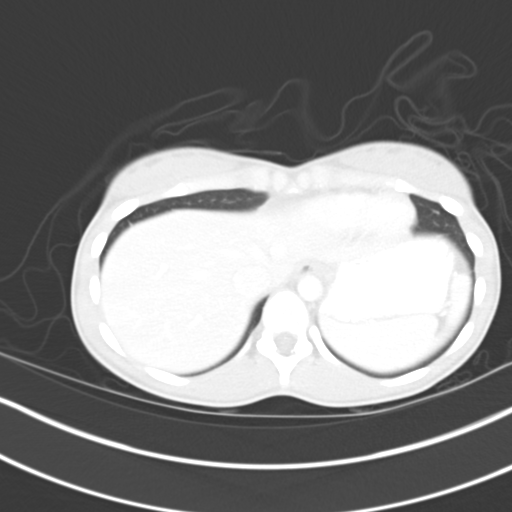
[im 84/88  soft-tissue]
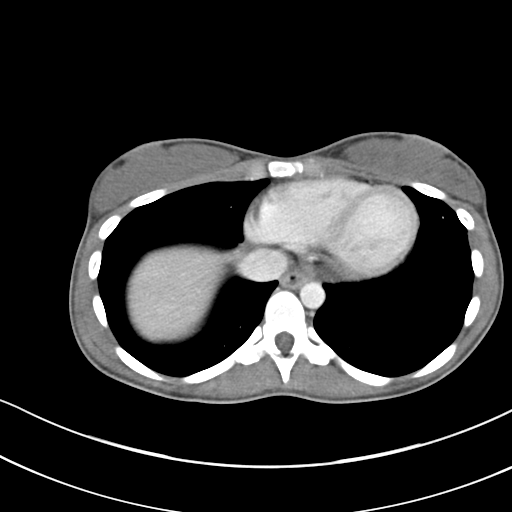
[im 84/88  lung]
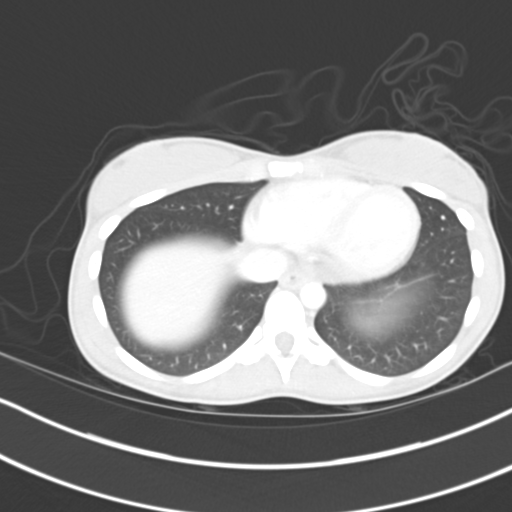

[15 of 32 positions shown; findings below may reference images not displayed]

FINDINGS: Lung bases are clear.

No focal liver lesions are identified. There is no biliary duct
dilatation. Gallbladder wall is not thickened.

Spleen, pancreas, and adrenals appear normal. Kidneys bilaterally
show no appreciable mass or hydronephrosis on either side. There is
no renal or ureteral calculus on either side.

In the pelvis, there is a small amount of free fluid in the
cul-de-sac. There is also some fluid immediately adjacent to the
right ovary. There is mild enhancement of the wall of the a 1.7 x
1.3 cm right ovarian cyst. There is no other evidence of pelvic
mass. Urinary bladder is midline with normal wall thickness.

Appendix appears normal.

There is no bowel obstruction.  No free air or portal venous air.

There is no adenopathy or abscess in the abdomen or pelvis. Aorta is
nonaneurysmal. There are no blastic or lytic bone lesions. The right
L1 lamina is hypoplastic, an anatomic variant.
IMPRESSION: There is fluid tracking from the right ovary to the cul-de-sac.
Suspect recent ovarian cyst rupture. Slight enhancement of the wall
of the remaining right ovarian cyst is supportive of this etiology.

Appendix appears normal. No abscess. No bowel obstruction. No renal
or ureteral calculus. No hydronephrosis.

## 2016-08-02 ENCOUNTER — Emergency Department (HOSPITAL_COMMUNITY)
Admission: EM | Admit: 2016-08-02 | Discharge: 2016-08-02 | Disposition: A | Discharge status: Home / Self Care | Payer: BLUE CROSS/BLUE SHIELD | Attending: Emergency Medicine | Admitting: Emergency Medicine

## 2016-08-02 ENCOUNTER — Encounter (HOSPITAL_COMMUNITY): Payer: Self-pay | Admitting: Emergency Medicine

## 2016-08-02 DIAGNOSIS — M545 Low back pain, unspecified: Secondary | ICD-10-CM

## 2016-08-02 DIAGNOSIS — Z79899 Other long term (current) drug therapy: Secondary | ICD-10-CM | POA: Insufficient documentation

## 2016-08-02 DIAGNOSIS — B373 Candidiasis of vulva and vagina: Secondary | ICD-10-CM | POA: Diagnosis not present

## 2016-08-02 DIAGNOSIS — B3731 Acute candidiasis of vulva and vagina: Secondary | ICD-10-CM

## 2016-08-02 DIAGNOSIS — M546 Pain in thoracic spine: Secondary | ICD-10-CM | POA: Insufficient documentation

## 2016-08-02 LAB — URINALYSIS, ROUTINE W REFLEX MICROSCOPIC
Bilirubin Urine: NEGATIVE
Glucose, UA: NEGATIVE mg/dL
Hgb urine dipstick: NEGATIVE
Ketones, ur: NEGATIVE mg/dL
Nitrite: NEGATIVE
Protein, ur: NEGATIVE mg/dL
Specific Gravity, Urine: 1.002 — ABNORMAL LOW (ref 1.005–1.030)
pH: 7 (ref 5.0–8.0)

## 2016-08-02 LAB — WET PREP, GENITAL
Sperm: NONE SEEN
Trich, Wet Prep: NONE SEEN

## 2016-08-02 LAB — PREGNANCY, URINE: Preg Test, Ur: NEGATIVE

## 2016-08-02 MED ORDER — IBUPROFEN 800 MG PO TABS
800.0000 mg | ORAL_TABLET | Freq: Once | ORAL | Status: AC
  Administered 2016-08-02: 800 mg via ORAL
  Filled 2016-08-02: qty 1

## 2016-08-02 MED ORDER — FLUCONAZOLE 150 MG PO TABS
150.0000 mg | ORAL_TABLET | Freq: Once | ORAL | Status: AC
  Administered 2016-08-02: 150 mg via ORAL
  Filled 2016-08-02: qty 1

## 2016-08-02 NOTE — ED Triage Notes (Signed)
Patient here with complaints of lower back pain. Reports that the pain started yesterday. Denies urinary complaints. Pain 10/10 lower back.

## 2016-08-02 NOTE — ED Provider Notes (Signed)
[facility]-EMERGENCY DEPT Provider Note   CSN: [id number] Arrival date & time: 08/02/16  1227     History   Chief Complaint Chief Complaint  Patient presents with  . Back Pain  . Vaginal Discharge    HPI Kayla Hubbard is a 21 y.o. female with history of gastritis, ruptured ovarian cyst (December 2014) and UTI who presents today with chief complaint acute onset, per aggressively worsening dull left sided mid back pain that began yesterday. Patient states pain was mild but present when she awoke yesterday morning, and acutely worsened after a nap yesterday at around 2 PM. She states pain is constant dull and achy, worsens with deep breathing and bending forward and rotation. Denies known trauma, falls, or recent heavy lifting. Has not tried anything for her symptoms. Pain at times radiates to the left flank. She denies abdominal pain, nausea, vomiting, fevers, chills, dysuria, hematuria, urinary urgency or frequency, melena, hematochezia. She states her symptoms do not feel like UTIs that she has had in the past and also do not feel like the ruptured ovarian cyst. Denies bowel/bladder incontinence, IVDU, hx of cancer, or night sweats.   She also endorses 2 days of abnormal vaginal discharge. She states vaginal discharge is more copious than normal and white and chunky and associated with vaginal itching. She denies vaginal pain or abnormal vaginal bleeding. She states this felt like a yeast infection which she has had in the past, and states it improved after using vaginal Monistat suppository.She states she has had sexual intercourse with one female in the past 6 months. However, she feels he likely has had more than one sexual partner. She states he has not complaining of any genitourinary symptoms.    The history is provided by the patient.    Past Medical History:  Diagnosis Date  . Gastritis     There are no active problems to display for this patient.   History reviewed. No pertinent  surgical history.  OB History    No data available       Home Medications    Prior to Admission medications   Medication Sig Start Date End Date Taking? Authorizing Provider  OVER THE COUNTER MEDICATION Place 2 sprays into both nostrils daily as needed (stuffy nose).   Yes [provider]  Tioconazole (MONISTAT 1-DAY VA) Place 1 suppository vaginally at bedtime.   Yes [provider]  famotidine (PEPCID) 20 MG tablet Take 1 tablet (20 mg total) by mouth 2 (two) times daily. Patient not taking: Reported on 01/12/2014 09/12/13   Renne CriglerGeiple, Joshua, PA-C  omeprazole (PRILOSEC) 20 MG capsule Take 1 capsule (20 mg total) by mouth daily. Patient not taking: Reported on 08/02/2016 01/12/14   Ward, Layla MawKristen N, DO  ondansetron (ZOFRAN ODT) 4 MG disintegrating tablet Take 1 tablet (4 mg total) by mouth every 8 (eight) hours as needed for nausea or vomiting. Patient not taking: Reported on 01/12/2014 09/12/13   Renne CriglerGeiple, Joshua, PA-C  promethazine (PHENERGAN) 25 MG tablet Take 1 tablet (25 mg total) by mouth every 6 (six) hours as needed for nausea or vomiting. Patient not taking: Reported on 08/02/2016 01/12/14   Ward, Layla MawKristen N, DO    Family History No family history on file.  Social History Social History  Substance Use Topics  . Smoking status: Never Smoker  . Smokeless tobacco: Never Used  . Alcohol use No     Allergies   Patient has no known allergies.   Review of Systems Review of Systems  Constitutional: Negative for chills and fever.  Respiratory: Negative for shortness of breath.   Cardiovascular: Negative for chest pain.  Gastrointestinal: Negative for abdominal pain, blood in stool, constipation, diarrhea, nausea and vomiting.  Genitourinary: Positive for flank pain and vaginal discharge. Negative for dysuria, hematuria, vaginal bleeding and vaginal pain.       Vaginal itching  Musculoskeletal: Positive for back pain. Negative for neck pain.  Neurological: Negative for  weakness, numbness and headaches.     Physical Exam Updated Vital Signs BP 104/72 (BP Location: Right Arm)   Pulse 81   Temp 98.1 F (36.7 C) (Oral)   Resp 17   SpO2 99%   Physical Exam  Constitutional: She is oriented to person, place, and time. She appears well-developed and well-nourished. No distress.  HENT:  Head: Normocephalic and atraumatic.  Right Ear: External ear normal.  Left Ear: External ear normal.  Eyes: Pupils are equal, round, and reactive to light. Conjunctivae are normal. Right eye exhibits no discharge. Left eye exhibits no discharge.  Neck: Normal range of motion. Neck supple. No JVD present. No tracheal deviation present.  Cardiovascular: Normal rate, regular rhythm, normal heart sounds and intact distal pulses.   2+ radial and DP/PT pulses bl  Pulmonary/Chest: Effort normal and breath sounds normal. She exhibits no tenderness.  Abdominal: Soft. Bowel sounds are normal. She exhibits no distension. There is no tenderness.  Murphy's sign absent, Rovsing sign absent, no CVA tenderness  Genitourinary:  Genitourinary Comments: Examination performed in the presence of a chaperone. No lesions to the external genitalia. Cervical os is closed with no bleeding. Cervix is pink and there are no lesions or petechia. Copious white chunky vaginal discharge in the vaginal vault. No masses or lesions to the vaginal wall. No cervical motion tenderness or adnexal tenderness.  Musculoskeletal: Normal range of motion. She exhibits tenderness. She exhibits no edema or deformity.  No midline spine TTP, left paraspinal muscle tenderness in the lower thoracic and upper lumbar region elicited only on forward flexion and lateral rotation. No deformity, crepitus, or step-off noted. 5/5 strength of BUE and BLE major muscle groups   Neurological: She is alert and oriented to person, place, and time. No cranial nerve deficit or sensory deficit.  Fluent speech, no facial droop, sensation intact  to soft touch of extremities, normal gait, and patient able to heel walk and toe walk without difficulty.   Skin: Skin is warm and dry. No erythema.  Psychiatric: She has a normal mood and affect. Her behavior is normal.  Nursing note and vitals reviewed.    ED Treatments / Results  Labs (all labs ordered are listed, but only abnormal results are displayed) Labs Reviewed  WET PREP, GENITAL - Abnormal; Notable for the following:       Result Value   Yeast Wet Prep HPF POC PRESENT (*)    Clue Cells Wet Prep HPF POC PRESENT (*)    WBC, Wet Prep HPF POC MANY (*)    All other components within normal limits  URINALYSIS, ROUTINE W REFLEX MICROSCOPIC - Abnormal; Notable for the following:    Color, Urine STRAW (*)    Specific Gravity, Urine 1.002 (*)    Leukocytes, UA LARGE (*)    Bacteria, UA FEW (*)    Squamous Epithelial / LPF 0-5 (*)    All other components within normal limits  PREGNANCY, URINE  GC/CHLAMYDIA PROBE AMP ([place]) NOT AT [facility]    EKG  EKG Interpretation None  Radiology No results found.  Procedures Procedures (including critical care time)  Medications Ordered in ED Medications  fluconazole (DIFLUCAN) tablet 150 mg (not administered)  ibuprofen (ADVIL,MOTRIN) tablet 800 mg (800 mg Oral Given 08/02/16 1435)     Initial Impression / Assessment and Plan / ED Course  I have reviewed the triage vital signs and the nursing notes.  Pertinent labs & imaging results that were available during my care of the patient were reviewed by me and considered in my medical decision making (see chart for details).     Patient with back pain reproducible on range of motion since yesterday as well as vaginal itching and discharge which has been progressively improved for 2 days. Afebrile, vital signs are stable. Patient is nontoxic in appearance. No red flag signs for CES or epidural abscess. Neurologically intact with good strength. Pain significantly improved  with her menstruation of 800 mg ibuprofen while in the ED. UA not concerning for UTI or pyelonephritis or nephrolithiasis in the absence of urinary symptoms. Examination of the genitalia not concerning for cervicitis or PID. Do not feel that prophylactic treatment for STD is necessary at this time. Wet prep does show yeast, WBCs, and clue cells. Based on physical examination, do not feel treatment of BV is necessary at this time. Will treat with Diflucan. Doubt TOA, ectopic pregnancy, appendicitis, or other intra-abdominal pathology for symptoms. Pain is likely musculoskeletal in nature. She stable for discharge home. Discussed NSAIDs, Tylenol, ice, heat and other symptomatic therapies. She'll follow-up with primary care if symptoms persist. Discussed indications for return to the ED. Patient and patient's mother verbalized understanding of and agreement with plan and patient is stable for discharge home at this time.   Final Clinical Impressions(s) / ED Diagnoses   Final diagnoses:  Yeast vaginitis  Acute left-sided thoracic back pain  Acute left-sided low back pain without sciatica    New Prescriptions New Prescriptions   No medications on file     Bennye Alm 08/02/16 1557    Nira Conn, MD 08/02/16 [phone]

## 2016-08-02 NOTE — Discharge Instructions (Signed)
You have been treated for a yeast infection while in the ED today. For your back pain, alternate 600 mg of ibuprofen and [phone] mg of Tylenol every 3 hours as needed for pain. Do not exceed 4000 mg of Tylenol daily. Apply ice or heat to the affected area for comfort. Take hot showers or baths and do some gentle stretching of the area to avoid stiffness. Follow up with primary care of symptoms persist for greater than 2 weeks. Return to the ED immediately if he develops any concerning signs or symptoms such as fevers, chills, vomiting, abdominal pain, blood in your urine or stool, vaginal bleeding, or other concerning symptoms. If any of your remaining lab work is abnormal, you will be informed.

## 2016-08-05 LAB — GC/CHLAMYDIA PROBE AMP (~~LOC~~) NOT AT ARMC
Chlamydia: NEGATIVE
Neisseria Gonorrhea: NEGATIVE

## 2016-11-16 ENCOUNTER — Other Ambulatory Visit: Payer: Self-pay

## 2016-11-16 ENCOUNTER — Encounter (HOSPITAL_COMMUNITY): Payer: Self-pay | Admitting: *Deleted

## 2016-11-16 ENCOUNTER — Emergency Department (HOSPITAL_COMMUNITY)
Admission: EM | Admit: 2016-11-16 | Discharge: 2016-11-16 | Disposition: A | Discharge status: Home / Self Care | Payer: BLUE CROSS/BLUE SHIELD | Attending: Emergency Medicine | Admitting: Emergency Medicine

## 2016-11-16 DIAGNOSIS — R112 Nausea with vomiting, unspecified: Secondary | ICD-10-CM | POA: Diagnosis present

## 2016-11-16 DIAGNOSIS — Z79899 Other long term (current) drug therapy: Secondary | ICD-10-CM | POA: Insufficient documentation

## 2016-11-16 DIAGNOSIS — E860 Dehydration: Secondary | ICD-10-CM | POA: Diagnosis not present

## 2016-11-16 DIAGNOSIS — K2900 Acute gastritis without bleeding: Secondary | ICD-10-CM | POA: Diagnosis not present

## 2016-11-16 HISTORY — DX: Gastro-esophageal reflux disease without esophagitis: K21.9

## 2016-11-16 LAB — URINALYSIS, ROUTINE W REFLEX MICROSCOPIC
Bilirubin Urine: NEGATIVE
Glucose, UA: NEGATIVE mg/dL
HGB URINE DIPSTICK: NEGATIVE
Ketones, ur: 20 mg/dL — AB
LEUKOCYTES UA: NEGATIVE
Nitrite: NEGATIVE
Protein, ur: NEGATIVE mg/dL
SPECIFIC GRAVITY, URINE: 1.017 (ref 1.005–1.030)
pH: 7 (ref 5.0–8.0)

## 2016-11-16 LAB — COMPREHENSIVE METABOLIC PANEL
ALBUMIN: 5.1 g/dL — AB (ref 3.5–5.0)
ALK PHOS: 41 U/L (ref 38–126)
ALT: 16 U/L (ref 14–54)
AST: 27 U/L (ref 15–41)
Anion gap: 18 — ABNORMAL HIGH (ref 5–15)
BILIRUBIN TOTAL: 0.8 mg/dL (ref 0.3–1.2)
BUN: 9 mg/dL (ref 6–20)
CALCIUM: 9.5 mg/dL (ref 8.9–10.3)
CO2: 17 mmol/L — ABNORMAL LOW (ref 22–32)
Chloride: 106 mmol/L (ref 101–111)
Creatinine, Ser: 0.63 mg/dL (ref 0.44–1.00)
GFR calc Af Amer: >60 mL/min (ref >60)
GLUCOSE: 121 mg/dL — AB (ref 65–99)
POTASSIUM: 3.3 mmol/L — AB (ref 3.5–5.1)
Sodium: 141 mmol/L (ref 135–145)
Total Protein: 8.5 g/dL — ABNORMAL HIGH (ref 6.5–8.1)

## 2016-11-16 LAB — CBC
HCT: 43.3 % (ref 36.0–46.0)
HEMOGLOBIN: 14.9 g/dL (ref 12.0–15.0)
MCH: 30.2 pg (ref 26.0–34.0)
MCHC: 34.4 g/dL (ref 30.0–36.0)
MCV: 87.8 fL (ref 78.0–100.0)
Platelets: 278 10*3/uL (ref 150–400)
RBC: 4.93 MIL/uL (ref 3.87–5.11)
RDW: 12.9 % (ref 11.5–15.5)
WBC: 11 10*3/uL — AB (ref 4.0–10.5)

## 2016-11-16 LAB — LIPASE, BLOOD: Lipase: 23 U/L (ref 11–51)

## 2016-11-16 LAB — I-STAT BETA HCG BLOOD, ED (MC, WL, AP ONLY)

## 2016-11-16 MED ORDER — KETOROLAC TROMETHAMINE 30 MG/ML IJ SOLN
30.0000 mg | Freq: Once | INTRAMUSCULAR | Status: AC
  Administered 2016-11-16: 30 mg via INTRAVENOUS
  Filled 2016-11-16: qty 1

## 2016-11-16 MED ORDER — ONDANSETRON HCL 4 MG/2ML IJ SOLN
4.0000 mg | Freq: Once | INTRAMUSCULAR | Status: AC
  Administered 2016-11-16: 4 mg via INTRAVENOUS
  Filled 2016-11-16: qty 2

## 2016-11-16 MED ORDER — METOCLOPRAMIDE HCL 5 MG/ML IJ SOLN
10.0000 mg | Freq: Once | INTRAMUSCULAR | Status: AC
  Administered 2016-11-16: 10 mg via INTRAVENOUS
  Filled 2016-11-16: qty 2

## 2016-11-16 MED ORDER — SODIUM CHLORIDE 0.9 % IV BOLUS (SEPSIS)
1000.0000 mL | Freq: Once | INTRAVENOUS | Status: AC
  Administered 2016-11-16: 1000 mL via INTRAVENOUS

## 2016-11-16 MED ORDER — MORPHINE SULFATE (PF) 4 MG/ML IV SOLN
4.0000 mg | Freq: Once | INTRAVENOUS | Status: AC
  Administered 2016-11-16: 4 mg via INTRAVENOUS
  Filled 2016-11-16: qty 1

## 2016-11-16 MED ORDER — ONDANSETRON 4 MG PO TBDP
4.0000 mg | ORAL_TABLET | Freq: Once | ORAL | Status: AC | PRN
  Administered 2016-11-16: 4 mg via ORAL
  Filled 2016-11-16: qty 1

## 2016-11-16 MED ORDER — ONDANSETRON 8 MG PO TBDP: 8.0000 mg | ORAL_TABLET | Freq: Three times a day (TID) | ORAL | 0 refills | Status: DC | PRN

## 2016-11-16 NOTE — ED Notes (Signed)
ED Provider at bedside. 

## 2016-11-16 NOTE — ED Notes (Signed)
Pt's mother does not want UA test done.

## 2016-11-16 NOTE — ED Triage Notes (Signed)
Pt bib mother and mother states the pt went out drinking 4 [employer].  Pt wasn't able to sleep last night and has been vomiting last night.  Mother states that pt vomited more than 6 times this morning.  Pt continues to be vomiting in triage.  Pt reports headache, chills, abd pain.

## 2016-11-16 NOTE — ED Notes (Signed)
Mother has now agreed to UA if the daughter can give sample

## 2016-11-16 NOTE — ED Notes (Signed)
Pt ambulated to BR and back to room with standby assist but was unable to give urine

## 2016-11-16 NOTE — ED Provider Notes (Signed)
[facility]-EMERGENCY DEPT Provider Note   CSN: [id number] Arrival date & time: 11/16/16  1100     History   Chief Complaint Chief Complaint  Patient presents with  . Emesis    HPI Kayla Hubbard is a 21 y.o. female.  HPI Patient presents emergency department complaint of nausea vomiting upper abdominal pain after binge drinking last night with her friends.  No hematemesis described.  Upper abdominal discomfort described.  Patient reports headache and discomfort.  Symptoms are moderate in severity   Past Medical History:  Diagnosis Date  . Gastritis   . GERD (gastroesophageal reflux disease)     There are no active problems to display for this patient.   History reviewed. No pertinent surgical history.  OB History    No data available       Home Medications    Prior to Admission medications   Medication Sig Start Date End Date Taking? Authorizing Provider  ibuprofen (ADVIL,MOTRIN) 200 MG tablet Take 400 mg every 6 (six) hours as needed by mouth for mild pain.   Yes [provider]  famotidine (PEPCID) 20 MG tablet Take 1 tablet (20 mg total) by mouth 2 (two) times daily. Patient not taking: Reported on 01/12/2014 09/12/13   Renne CriglerGeiple, Joshua, PA-C  omeprazole (PRILOSEC) 20 MG capsule Take 1 capsule (20 mg total) by mouth daily. Patient not taking: Reported on 08/02/2016 01/12/14   Ward, Layla MawKristen N, DO  ondansetron (ZOFRAN ODT) 8 MG disintegrating tablet Take 1 tablet (8 mg total) every 8 (eight) hours as needed by mouth for nausea or vomiting. 11/16/16   Azalia Bilisampos, Brieana Shimmin, MD  promethazine (PHENERGAN) 25 MG tablet Take 1 tablet (25 mg total) by mouth every 6 (six) hours as needed for nausea or vomiting. Patient not taking: Reported on 08/02/2016 01/12/14   Ward, Layla MawKristen N, DO    Family History No family history on file.  Social History Social History   Tobacco Use  . Smoking status: Never Smoker  . Smokeless tobacco: Never Used  Substance Use  Topics  . Alcohol use: Yes    Comment: occasional  . Drug use: No     Allergies   Patient has no known allergies.   Review of Systems Review of Systems  All other systems reviewed and are negative.    Physical Exam Updated Vital Signs BP 133/88 (BP Location: Left Arm)   Pulse 86   Temp 98.4 F (36.9 C) (Oral)   Resp 20   Ht 5\' 4"  (1.626 m)   Wt 47.6 kg (105 lb)   LMP 11/02/2016   SpO2 100%   BMI 18.02 kg/m   Physical Exam  Constitutional: She is oriented to person, place, and time. She appears well-developed and well-nourished. No distress.  HENT:  Head: Normocephalic and atraumatic.  Eyes: EOM are normal.  Neck: Normal range of motion.  Cardiovascular: Normal rate and regular rhythm.  Pulmonary/Chest: Effort normal and breath sounds normal.  Abdominal: Soft. She exhibits no distension. There is no tenderness.  Musculoskeletal: Normal range of motion.  Neurological: She is alert and oriented to person, place, and time.  Skin: Skin is warm and dry.  Psychiatric: She has a normal mood and affect. Judgment normal.  Nursing note and vitals reviewed.    ED Treatments / Results  Labs (all labs ordered are listed, but only abnormal results are displayed) Labs Reviewed  COMPREHENSIVE METABOLIC PANEL - Abnormal; Notable for the following components:      Result Value  Potassium 3.3 (*)    CO2 17 (*)    Glucose, Bld 121 (*)    Total Protein 8.5 (*)    Albumin 5.1 (*)    Anion gap 18 (*)    All other components within normal limits  CBC - Abnormal; Notable for the following components:   WBC 11.0 (*)    All other components within normal limits  URINALYSIS, ROUTINE W REFLEX MICROSCOPIC - Abnormal; Notable for the following components:   Ketones, ur 20 (*)    All other components within normal limits  LIPASE, BLOOD  I-STAT BETA HCG BLOOD, ED ([facility], [facility], [facility] ONLY)    EKG  EKG Interpretation None       Radiology No results found.  Procedures Procedures  (including critical care time)  Medications Ordered in ED Medications  ondansetron (ZOFRAN-ODT) disintegrating tablet 4 mg (4 mg Oral Given 11/16/16 1146)  sodium chloride 0.9 % bolus 1,000 mL (0 mLs Intravenous Stopped 11/16/16 1344)  ondansetron (ZOFRAN) injection 4 mg (4 mg Intravenous Given 11/16/16 1248)  metoCLOPramide (REGLAN) injection 10 mg (10 mg Intravenous Given 11/16/16 1340)  ketorolac (TORADOL) 30 MG/ML injection 30 mg (30 mg Intravenous Given 11/16/16 1340)  morphine 4 MG/ML injection 4 mg (4 mg Intravenous Given 11/16/16 1340)     Initial Impression / Assessment and Plan / ED Course  I have reviewed the triage vital signs and the nursing notes.  Pertinent labs & imaging results that were available during my care of the patient were reviewed by me and considered in my medical decision making (see chart for details).     No more vomiting in the emergency department.  Nausea improved.  Suspect alcoholic gastritis.  Discharged home in good condition.  Primary care follow-up.  Patient and family understand to return the emergency department for new or worsening symptoms  Final Clinical Impressions(s) / ED Diagnoses   Final diagnoses:  Acute gastritis without hemorrhage, unspecified gastritis type  Nausea and vomiting, intractability of vomiting not specified, unspecified vomiting type  Dehydration    ED Discharge Orders        Ordered    ondansetron (ZOFRAN ODT) 8 MG disintegrating tablet  Every 8 hours PRN     11/16/16 1523       Azalia Bilisampos, Lacreasha Hinds, MD 11/16/16 1535

## 2019-09-05 ENCOUNTER — Encounter (HOSPITAL_BASED_OUTPATIENT_CLINIC_OR_DEPARTMENT_OTHER): Payer: Self-pay | Admitting: *Deleted

## 2019-09-05 ENCOUNTER — Emergency Department (HOSPITAL_BASED_OUTPATIENT_CLINIC_OR_DEPARTMENT_OTHER)
Admission: EM | Admit: 2019-09-05 | Discharge: 2019-09-05 | Disposition: A | Discharge status: Home / Self Care | Payer: BLUE CROSS/BLUE SHIELD | Attending: Emergency Medicine | Admitting: Emergency Medicine

## 2019-09-05 ENCOUNTER — Other Ambulatory Visit: Payer: Self-pay

## 2019-09-05 DIAGNOSIS — K2970 Gastritis, unspecified, without bleeding: Secondary | ICD-10-CM | POA: Insufficient documentation

## 2019-09-05 DIAGNOSIS — R1013 Epigastric pain: Secondary | ICD-10-CM | POA: Diagnosis present

## 2019-09-05 DIAGNOSIS — K2920 Alcoholic gastritis without bleeding: Secondary | ICD-10-CM

## 2019-09-05 DIAGNOSIS — Z79899 Other long term (current) drug therapy: Secondary | ICD-10-CM | POA: Insufficient documentation

## 2019-09-05 DIAGNOSIS — R112 Nausea with vomiting, unspecified: Secondary | ICD-10-CM

## 2019-09-05 LAB — COMPREHENSIVE METABOLIC PANEL
ALT: 17 U/L (ref 0–44)
AST: 32 U/L (ref 15–41)
Albumin: 5.1 g/dL — ABNORMAL HIGH (ref 3.5–5.0)
Alkaline Phosphatase: 43 U/L (ref 38–126)
Anion gap: 20 — ABNORMAL HIGH (ref 5–15)
BUN: 11 mg/dL (ref 6–20)
CO2: 20 mmol/L — ABNORMAL LOW (ref 22–32)
Calcium: 9.4 mg/dL (ref 8.9–10.3)
Chloride: 95 mmol/L — ABNORMAL LOW (ref 98–111)
Creatinine, Ser: 0.78 mg/dL (ref 0.44–1.00)
GFR calc Af Amer: >60 mL/min (ref >60)
GFR calc non Af Amer: >60 mL/min (ref >60)
Glucose, Bld: 149 mg/dL — ABNORMAL HIGH (ref 70–99)
Potassium: 3.5 mmol/L (ref 3.5–5.1)
Sodium: 135 mmol/L (ref 135–145)
Total Bilirubin: 1.2 mg/dL (ref 0.3–1.2)
Total Protein: 8.4 g/dL — ABNORMAL HIGH (ref 6.5–8.1)

## 2019-09-05 LAB — CBC
HCT: 43.1 % (ref 36.0–46.0)
Hemoglobin: 14.3 g/dL (ref 12.0–15.0)
MCH: 31.6 pg (ref 26.0–34.0)
MCHC: 33.2 g/dL (ref 30.0–36.0)
MCV: 95.1 fL (ref 80.0–100.0)
Platelets: 245 10*3/uL (ref 150–400)
RBC: 4.53 MIL/uL (ref 3.87–5.11)
RDW: 13.1 % (ref 11.5–15.5)
WBC: 8.3 10*3/uL (ref 4.0–10.5)
nRBC: 0 % (ref 0.0–0.2)

## 2019-09-05 LAB — URINALYSIS, ROUTINE W REFLEX MICROSCOPIC
Bilirubin Urine: NEGATIVE
Glucose, UA: NEGATIVE mg/dL
Ketones, ur: >80 mg/dL — AB
Nitrite: NEGATIVE
Protein, ur: NEGATIVE mg/dL
Specific Gravity, Urine: >1.03 — ABNORMAL HIGH (ref 1.005–1.030)
pH: 6 (ref 5.0–8.0)

## 2019-09-05 LAB — URINALYSIS, MICROSCOPIC (REFLEX)

## 2019-09-05 LAB — PREGNANCY, URINE: Preg Test, Ur: NEGATIVE

## 2019-09-05 MED ORDER — FAMOTIDINE 20 MG PO TABS: 20.0000 mg | ORAL_TABLET | Freq: Two times a day (BID) | ORAL | 0 refills | Status: DC

## 2019-09-05 MED ORDER — ONDANSETRON 8 MG PO TBDP: ORAL_TABLET | ORAL | 0 refills | Status: DC

## 2019-09-05 MED ORDER — HALOPERIDOL LACTATE 5 MG/ML IJ SOLN
2.0000 mg | Freq: Once | INTRAMUSCULAR | Status: AC
  Administered 2019-09-05: 05:00:00 2 mg via INTRAVENOUS
  Filled 2019-09-05: qty 1

## 2019-09-05 MED ORDER — ONDANSETRON HCL 4 MG/2ML IJ SOLN
4.0000 mg | Freq: Once | INTRAMUSCULAR | Status: AC | PRN
  Administered 2019-09-05: 04:00:00 4 mg via INTRAVENOUS
  Filled 2019-09-05: qty 2

## 2019-09-05 MED ORDER — ALUM & MAG HYDROXIDE-SIMETH 200-200-20 MG/5ML PO SUSP
30.0000 mL | Freq: Once | ORAL | Status: AC
  Administered 2019-09-05: 06:00:00 30 mL via ORAL
  Filled 2019-09-05: qty 30

## 2019-09-05 MED ORDER — SODIUM CHLORIDE 0.9 % IV BOLUS
500.0000 mL | Freq: Once | INTRAVENOUS | Status: AC
  Administered 2019-09-05: 500 mL via INTRAVENOUS

## 2019-09-05 NOTE — ED Provider Notes (Signed)
[facility] EMERGENCY DEPARTMENT Provider Note   CSN: [id number] Arrival date & time: 09/05/19  0346     History Chief Complaint  Patient presents with  . vomiting    Kayla Hubbard is a 24 y.o. female.  The history is provided by the patient.  Emesis Severity:  Moderate Timing:  Intermittent Quality:  Stomach contents Progression:  Unchanged Chronicity:  Recurrent Recent urination:  Normal Context: not post-tussive   Relieved by:  Nothing Worsened by:  Nothing Ineffective treatments:  None tried Associated symptoms: no arthralgias, no diarrhea and no fever   Associated symptoms comment:  Epigastric pain Risk factors: alcohol use   Patient with alcohol abuse and habitual marijuana use presents with vomiting following binging for the last few days.  No f/c/r.      Past Medical History:  Diagnosis Date  . Gastritis   . GERD (gastroesophageal reflux disease)     There are no problems to display for this patient.   History reviewed. No pertinent surgical history.   OB History   No obstetric history on file.     History reviewed. No pertinent family history.  Social History   Tobacco Use  . Smoking status: Never Smoker  . Smokeless tobacco: Never Used  Vaping Use  . Vaping Use: Never used  Substance Use Topics  . Alcohol use: Yes    Comment: pt states she has been drinking everyday for the past month   . Drug use: No    Home Medications Prior to Admission medications   Medication Sig Start Date End Date Taking? Authorizing Provider  famotidine (PEPCID) 20 MG tablet Take 1 tablet (20 mg total) by mouth 2 (two) times daily. Patient not taking: Reported on 01/12/2014 09/12/13   Renne Crigler, PA-C  ibuprofen (ADVIL,MOTRIN) 200 MG tablet Take 400 mg every 6 (six) hours as needed by mouth for mild pain.    [provider]  omeprazole (PRILOSEC) 20 MG capsule Take 1 capsule (20 mg total) by mouth daily. Patient not taking: Reported on  08/02/2016 01/12/14   Ward, Layla Maw, DO  ondansetron (ZOFRAN ODT) 8 MG disintegrating tablet Take 1 tablet (8 mg total) every 8 (eight) hours as needed by mouth for nausea or vomiting. 11/16/16   Azalia Bilis, MD  promethazine (PHENERGAN) 25 MG tablet Take 1 tablet (25 mg total) by mouth every 6 (six) hours as needed for nausea or vomiting. Patient not taking: Reported on 08/02/2016 01/12/14   Ward, Layla Maw, DO    Allergies    Patient has no known allergies.  Review of Systems   Review of Systems  Constitutional: Negative for fever.  HENT: Negative for congestion.   Eyes: Negative for visual disturbance.  Respiratory: Negative for chest tightness.   Cardiovascular: Negative for chest pain.  Gastrointestinal: Positive for nausea and vomiting. Negative for anal bleeding and diarrhea.  Genitourinary: Negative for difficulty urinating.  Musculoskeletal: Negative for arthralgias.  Skin: Negative for rash.  Neurological: Negative for dizziness.  Psychiatric/Behavioral: Negative for agitation.  All other systems reviewed and are negative.   Physical Exam Updated Vital Signs BP 104/79   Pulse 87   Temp 97.6 F (36.4 C) (Oral)   Resp 11   Ht 5\' 4"  (1.626 m)   Wt 54 kg   LMP 08/26/2019 (Approximate)   SpO2 100%   BMI 20.43 kg/m   Physical Exam Vitals and nursing note reviewed.  Constitutional:      General: She is not in acute  distress.    Appearance: Normal appearance. She is not ill-appearing.  HENT:     Head: Normocephalic and atraumatic.     Nose: Nose normal.  Eyes:     Conjunctiva/sclera: Conjunctivae normal.     Pupils: Pupils are equal, round, and reactive to light.  Cardiovascular:     Rate and Rhythm: Normal rate and regular rhythm.     Pulses: Normal pulses.     Heart sounds: Normal heart sounds.  Pulmonary:     Effort: Pulmonary effort is normal.     Breath sounds: Normal breath sounds.  Abdominal:     General: Abdomen is flat. Bowel sounds are normal.      Tenderness: There is no abdominal tenderness. There is no guarding or rebound.  Musculoskeletal:        General: Normal range of motion.     Cervical back: Normal range of motion and neck supple.  Skin:    General: Skin is warm and dry.     Capillary Refill: Capillary refill takes less than 2 seconds.  Neurological:     General: No focal deficit present.     Mental Status: She is alert and oriented to person, place, and time.     Deep Tendon Reflexes: Reflexes normal.  Psychiatric:        Mood and Affect: Mood normal.        Behavior: Behavior normal.     ED Results / Procedures / Treatments   Labs (all labs ordered are listed, but only abnormal results are displayed) Results for orders placed or performed during the hospital encounter of 09/05/19  Comprehensive metabolic panel  Result Value Ref Range   Sodium 135 135 - 145 mmol/L   Potassium 3.5 3.5 - 5.1 mmol/L   Chloride 95 (L) 98 - 111 mmol/L   CO2 20 (L) 22 - 32 mmol/L   Glucose, Bld 149 (H) 70 - 99 mg/dL   BUN 11 6 - 20 mg/dL   Creatinine, Ser 1.61 0.44 - 1.00 mg/dL   Calcium 9.4 8.9 - 09.6 mg/dL   Total Protein 8.4 (H) 6.5 - 8.1 g/dL   Albumin 5.1 (H) 3.5 - 5.0 g/dL   AST 32 15 - 41 U/L   ALT 17 0 - 44 U/L   Alkaline Phosphatase 43 38 - 126 U/L   Total Bilirubin 1.2 0.3 - 1.2 mg/dL   GFR calc non Af Amer >60 >60 mL/min   GFR calc Af Amer >60 >60 mL/min   Anion gap 20 (H) 5 - 15  CBC  Result Value Ref Range   WBC 8.3 4.0 - 10.5 K/uL   RBC 4.53 3.87 - 5.11 MIL/uL   Hemoglobin 14.3 12.0 - 15.0 g/dL   HCT 04.5 36 - 46 %   MCV 95.1 80.0 - 100.0 fL   MCH 31.6 26.0 - 34.0 pg   MCHC 33.2 30.0 - 36.0 g/dL   RDW 40.9 81.1 - 91.4 %   Platelets 245 150 - 400 K/uL   nRBC 0.0 0.0 - 0.2 %  Urinalysis, Routine w reflex microscopic  Result Value Ref Range   Color, Urine YELLOW YELLOW   APPearance CLOUDY (A) CLEAR   Specific Gravity, Urine >1.030 (H) 1.005 - 1.030   pH 6.0 5.0 - 8.0   Glucose, UA NEGATIVE NEGATIVE mg/dL    Hgb urine dipstick LARGE (A) NEGATIVE   Bilirubin Urine NEGATIVE NEGATIVE   Ketones, ur >80 (A) NEGATIVE mg/dL   Protein, ur NEGATIVE NEGATIVE  mg/dL   Nitrite NEGATIVE NEGATIVE   Leukocytes,Ua TRACE (A) NEGATIVE  Pregnancy, urine  Result Value Ref Range   Preg Test, Ur NEGATIVE NEGATIVE  Urinalysis, Microscopic (reflex)  Result Value Ref Range   RBC / HPF 0-5 0 - 5 RBC/hpf   WBC, UA 0-5 0 - 5 WBC/hpf   Bacteria, UA FEW (A) NONE SEEN   Squamous Epithelial / LPF 0-5 0 - 5   No results found.  EKG None  Radiology No results found.  Procedures Procedures (including critical care time)  Medications Ordered in ED Medications  ondansetron (ZOFRAN) injection 4 mg (4 mg Intravenous Given 09/05/19 0421)  sodium chloride 0.9 % bolus 500 mL (0 mLs Intravenous Stopped 09/05/19 0600)  haloperidol lactate (HALDOL) injection 2 mg (2 mg Intravenous Given 09/05/19 0527)  alum & mag hydroxide-simeth (MAALOX/MYLANTA) 200-200-20 MG/5ML suspension 30 mL (30 mLs Oral Given 09/05/19 3474)    ED Course  I have reviewed the triage vital signs and the nursing notes.  Pertinent labs & imaging results that were available during my care of the patient were reviewed by me and considered in my medical decision making (see chart for details).    Feeling better post medication. Pain is gastritis and I suspect the vomiting is cannabis hyperemesis. PO challenged successfully, patient has been instructed to stop alcohol and marijuana and eat a healthy diet.    Wendie Loving was evaluated in Emergency Department on 09/05/2019 for the symptoms described in the history of present illness. She was evaluated in the context of the global COVID-19 pandemic, which necessitated consideration that the patient might be at risk for infection with the SARS-CoV-2 virus that causes COVID-19. Institutional protocols and algorithms that pertain to the evaluation of patients at risk for COVID-19 are in a state of rapid change  based on information released by regulatory bodies including the CDC and federal and state organizations. These policies and algorithms were followed during the patient's care in the ED.  Final Clinical Impression(s) / ED Diagnoses Return for intractable cough, coughing up blood,fevers >100.4 unrelieved by medication, shortness of breath, intractable vomiting, chest pain, shortness of breath, weakness,numbness, changes in speech, facial asymmetry,abdominal pain, passing out,Inability to tolerate liquids or food, cough, altered mental status or any concerns. No signs of systemic illness or infection. The patient is nontoxic-appearing on exam and vital signs are within normal limits.   I have reviewed the triage vital signs and the nursing notes. Pertinent labs &imaging results that were available during my care of the patient were reviewed by me and considered in my medical decision making (see chart for details).After history, exam, and medical workup I feel the patient has beenappropriately medically screened and is safe for discharge home. Pertinent diagnoses were discussed with the patient. Patient was given return precautions.    Yadier Bramhall, MD 09/05/19 [phone]

## 2019-09-05 NOTE — ED Triage Notes (Addendum)
Pt states she has been drinking "a lot" of etoh on Friday.. states drinking all day. States she was drinking wine/liquor.Did not eat all day Friday or Sat due to her throat being irritated. States she took 2 Melatonin Sat night. States this is when the vomiting started. "dry heaving and acid" states she has been drinking about every day for the past month.

## 2019-10-06 ENCOUNTER — Encounter (HOSPITAL_BASED_OUTPATIENT_CLINIC_OR_DEPARTMENT_OTHER): Payer: Self-pay | Admitting: Emergency Medicine

## 2019-10-06 ENCOUNTER — Emergency Department (HOSPITAL_BASED_OUTPATIENT_CLINIC_OR_DEPARTMENT_OTHER)
Admission: EM | Admit: 2019-10-06 | Discharge: 2019-10-06 | Disposition: A | Discharge status: Home / Self Care | Payer: BLUE CROSS/BLUE SHIELD | Attending: Emergency Medicine | Admitting: Emergency Medicine

## 2019-10-06 ENCOUNTER — Other Ambulatory Visit: Payer: Self-pay

## 2019-10-06 DIAGNOSIS — K2971 Gastritis, unspecified, with bleeding: Secondary | ICD-10-CM | POA: Insufficient documentation

## 2019-10-06 DIAGNOSIS — K2970 Gastritis, unspecified, without bleeding: Secondary | ICD-10-CM

## 2019-10-06 DIAGNOSIS — R109 Unspecified abdominal pain: Secondary | ICD-10-CM | POA: Diagnosis present

## 2019-10-06 LAB — CBC
HCT: 41.7 % (ref 36.0–46.0)
Hemoglobin: 13.9 g/dL (ref 12.0–15.0)
MCH: 31.7 pg (ref 26.0–34.0)
MCHC: 33.3 g/dL (ref 30.0–36.0)
MCV: 95.2 fL (ref 80.0–100.0)
Platelets: 211 10*3/uL (ref 150–400)
RBC: 4.38 MIL/uL (ref 3.87–5.11)
RDW: 12.9 % (ref 11.5–15.5)
WBC: 12.3 10*3/uL — ABNORMAL HIGH (ref 4.0–10.5)
nRBC: 0 % (ref 0.0–0.2)

## 2019-10-06 LAB — COMPREHENSIVE METABOLIC PANEL
ALT: 11 U/L (ref 0–44)
AST: 20 U/L (ref 15–41)
Albumin: 4.6 g/dL (ref 3.5–5.0)
Alkaline Phosphatase: 29 U/L — ABNORMAL LOW (ref 38–126)
Anion gap: 13 (ref 5–15)
BUN: 7 mg/dL (ref 6–20)
CO2: 23 mmol/L (ref 22–32)
Calcium: 9.3 mg/dL (ref 8.9–10.3)
Chloride: 101 mmol/L (ref 98–111)
Creatinine, Ser: 0.61 mg/dL (ref 0.44–1.00)
GFR calc Af Amer: >60 mL/min (ref >60)
GFR calc non Af Amer: >60 mL/min (ref >60)
Glucose, Bld: 128 mg/dL — ABNORMAL HIGH (ref 70–99)
Potassium: 3.8 mmol/L (ref 3.5–5.1)
Sodium: 137 mmol/L (ref 135–145)
Total Bilirubin: 0.8 mg/dL (ref 0.3–1.2)
Total Protein: 7.3 g/dL (ref 6.5–8.1)

## 2019-10-06 LAB — URINALYSIS, MICROSCOPIC (REFLEX)

## 2019-10-06 LAB — URINALYSIS, ROUTINE W REFLEX MICROSCOPIC
Bilirubin Urine: NEGATIVE
Glucose, UA: NEGATIVE mg/dL
Ketones, ur: 40 mg/dL — AB
Leukocytes,Ua: NEGATIVE
Nitrite: NEGATIVE
Protein, ur: NEGATIVE mg/dL
Specific Gravity, Urine: 1.02 (ref 1.005–1.030)
pH: 8.5 — ABNORMAL HIGH (ref 5.0–8.0)

## 2019-10-06 LAB — LIPASE, BLOOD: Lipase: 31 U/L (ref 11–51)

## 2019-10-06 LAB — PREGNANCY, URINE: Preg Test, Ur: NEGATIVE

## 2019-10-06 MED ORDER — SODIUM CHLORIDE 0.9 % IV BOLUS
1000.0000 mL | Freq: Once | INTRAVENOUS | Status: AC
  Administered 2019-10-06: 1000 mL via INTRAVENOUS

## 2019-10-06 MED ORDER — FAMOTIDINE IN NACL 20-0.9 MG/50ML-% IV SOLN
20.0000 mg | Freq: Once | INTRAVENOUS | Status: AC
  Administered 2019-10-06: 20 mg via INTRAVENOUS
  Filled 2019-10-06: qty 50

## 2019-10-06 MED ORDER — ALUM & MAG HYDROXIDE-SIMETH 200-200-20 MG/5ML PO SUSP
30.0000 mL | Freq: Once | ORAL | Status: AC
  Administered 2019-10-06: 30 mL via ORAL
  Filled 2019-10-06: qty 30

## 2019-10-06 MED ORDER — FAMOTIDINE 20 MG PO TABS: 20.0000 mg | ORAL_TABLET | Freq: Two times a day (BID) | ORAL | 0 refills | Status: DC

## 2019-10-06 MED ORDER — LIDOCAINE VISCOUS HCL 2 % MT SOLN
15.0000 mL | Freq: Once | OROMUCOSAL | Status: AC
  Administered 2019-10-06: 15 mL via ORAL
  Filled 2019-10-06: qty 15

## 2019-10-06 MED ORDER — PANTOPRAZOLE SODIUM 20 MG PO TBEC: 20.0000 mg | DELAYED_RELEASE_TABLET | Freq: Every day | ORAL | 0 refills | Status: DC

## 2019-10-06 MED ORDER — PANTOPRAZOLE SODIUM 40 MG IV SOLR
40.0000 mg | Freq: Once | INTRAVENOUS | Status: AC
  Administered 2019-10-06: 40 mg via INTRAVENOUS
  Filled 2019-10-06: qty 40

## 2019-10-06 MED ORDER — ONDANSETRON 4 MG PO TBDP
4.0000 mg | ORAL_TABLET | Freq: Once | ORAL | Status: AC | PRN
  Administered 2019-10-06: 4 mg via ORAL
  Filled 2019-10-06: qty 1

## 2019-10-06 NOTE — ED Triage Notes (Signed)
Pt took a zinc and herbal tea this am.  Twenty minutes later she began to vomit.  Pt states she has vomited over 10 times.  Pt also having abdominal pain.

## 2019-10-06 NOTE — Discharge Instructions (Addendum)
At this time there does not appear to be the presence of an emergent medical condition, however there is always the potential for conditions to change. Please read and follow the below instructions.  Please return to the Emergency Department immediately for any new or worsening symptoms. Please be sure to follow up with your Primary Care Provider within one week regarding your visit today; please call their office to schedule an appointment even if you are feeling better for a follow-up visit. Please take the medications Pepcid and Protonix as prescribed to help with acid reflux related symptoms.  Please drink plenty of water and get plenty of rest.  Avoid spicy foods and alcohol as these may exacerbate inflammation of the stomach.  Go to the nearest Emergency Department immediately if: You have fever or chills You throw up blood or something that looks like coffee grounds. You have black or dark red poop. You throw up any time you try to drink fluids. Your stomach pain gets worse. You have any new/concerning or worsening of symptoms.   Please read the additional information packets attached to your discharge summary.  Do not take your medicine if  develop an itchy rash, swelling in your mouth or lips, or difficulty breathing; call 911 and seek immediate emergency medical attention if this occurs.  You may review your lab tests and imaging results in their entirety on your MyChart account.  Please discuss all results of fully with your primary care provider and other specialist at your follow-up visit.  Note: Portions of this text may have been transcribed using voice recognition software. Every effort was made to ensure accuracy; however, inadvertent computerized transcription errors may still be present.

## 2019-10-06 NOTE — ED Provider Notes (Signed)
[facility] EMERGENCY DEPARTMENT Provider Note   CSN: [id number] Arrival date & time: 10/06/19  1144     History Chief Complaint  Patient presents with  . Abdominal Pain    Kayla Hubbard is a 24 y.o. female history of GERD/gastritis, otherwise healthy.  Patient reports that around 8:30 AM this morning she was drinking some herbal tea and took her daily supplements including zinc.  Approximately 20 minutes later she developed upper abdominal pain described as an aching sensation which is constant nonradiating no aggravating or alleviating factors, moderate in intensity.  Shortly after she had one episode of nonbloody diarrhea followed by multiple episodes of nonbloody/nonbilious emesis.  Denies fever/chills, chest pain/shortness of breath, hematemesis, hematochezia, melena, dysuria/hematuria, vaginal bleeding/discharge, concern for STI or any additional concerns.  Of note patient reports that she only takes her gastritis medication as needed and did not attempt today.  HPI     Past Medical History:  Diagnosis Date  . Gastritis   . GERD (gastroesophageal reflux disease)     There are no problems to display for this patient.   No past surgical history on file.   OB History   No obstetric history on file.     No family history on file.  Social History   Tobacco Use  . Smoking status: Never Smoker  . Smokeless tobacco: Never Used  Vaping Use  . Vaping Use: Never used  Substance Use Topics  . Alcohol use: Yes    Comment: pt states she has been drinking everyday for the past month   . Drug use: No    Home Medications Prior to Admission medications   Medication Sig Start Date End Date Taking? Authorizing Provider  famotidine (PEPCID) 20 MG tablet Take 1 tablet (20 mg total) by mouth 2 (two) times daily. 10/06/19   Harlene Salts A, PA-C  pantoprazole (PROTONIX) 20 MG tablet Take 1 tablet (20 mg total) by mouth daily. 10/06/19   Harlene Salts A, PA-C    omeprazole (PRILOSEC) 20 MG capsule Take 1 capsule (20 mg total) by mouth daily. Patient not taking: Reported on 08/02/2016 01/12/14 10/06/19  Ward, Layla Maw, DO  promethazine (PHENERGAN) 25 MG tablet Take 1 tablet (25 mg total) by mouth every 6 (six) hours as needed for nausea or vomiting. Patient not taking: Reported on 08/02/2016 01/12/14 10/06/19  Ward, Layla Maw, DO    Allergies    Patient has no known allergies.  Review of Systems   Review of Systems Ten systems are reviewed and are negative for acute change except as noted in the HPI  Physical Exam Updated Vital Signs BP 124/88 (BP Location: Right Arm)   Pulse 67   Temp 97.9 F (36.6 C)   Resp 19   Ht 5\' 4"  (1.626 m)   Wt 50.8 kg   LMP 09/21/2019   SpO2 100%   BMI 19.22 kg/m   Physical Exam Constitutional:      General: She is not in acute distress.    Appearance: Normal appearance. She is well-developed. She is not ill-appearing or diaphoretic.  HENT:     Head: Normocephalic and atraumatic.  Eyes:     General: Vision grossly intact. Gaze aligned appropriately.     Pupils: Pupils are equal, round, and reactive to light.  Neck:     Trachea: Trachea and phonation normal.  Pulmonary:     Effort: Pulmonary effort is normal. No respiratory distress.  Abdominal:     General: There is no  distension.     Palpations: Abdomen is soft.     Tenderness: There is no abdominal tenderness. There is no guarding or rebound.  Genitourinary:    Comments: Refused by patient. Musculoskeletal:        General: Normal range of motion.     Cervical back: Normal range of motion.  Skin:    General: Skin is warm and dry.  Neurological:     Mental Status: She is alert.     GCS: GCS eye subscore is 4. GCS verbal subscore is 5. GCS motor subscore is 6.     Comments: Speech is clear and goal oriented, follows commands Major Cranial nerves without deficit, no facial droop Moves extremities without ataxia, coordination intact  Psychiatric:         Behavior: Behavior normal.     ED Results / Procedures / Treatments   Labs (all labs ordered are listed, but only abnormal results are displayed) Labs Reviewed  COMPREHENSIVE METABOLIC PANEL - Abnormal; Notable for the following components:      Result Value   Glucose, Bld 128 (*)    Alkaline Phosphatase 29 (*)    All other components within normal limits  CBC - Abnormal; Notable for the following components:   WBC 12.3 (*)    All other components within normal limits  URINALYSIS, ROUTINE W REFLEX MICROSCOPIC - Abnormal; Notable for the following components:   APPearance CLOUDY (*)    pH 8.5 (*)    Hgb urine dipstick SMALL (*)    Ketones, ur 40 (*)    All other components within normal limits  URINALYSIS, MICROSCOPIC (REFLEX) - Abnormal; Notable for the following components:   Bacteria, UA FEW (*)    All other components within normal limits  LIPASE, BLOOD  PREGNANCY, URINE    EKG None  Radiology No results found.  Procedures Procedures (including critical care time)  Medications Ordered in ED Medications  ondansetron (ZOFRAN-ODT) disintegrating tablet 4 mg (4 mg Oral Given 10/06/19 1613)  sodium chloride 0.9 % bolus 1,000 mL (0 mLs Intravenous Stopped 10/06/19 1830)  pantoprazole (PROTONIX) injection 40 mg (40 mg Intravenous Given 10/06/19 1723)  alum & mag hydroxide-simeth (MAALOX/MYLANTA) 200-200-20 MG/5ML suspension 30 mL (30 mLs Oral Given 10/06/19 1723)    And  lidocaine (XYLOCAINE) 2 % viscous mouth solution 15 mL (15 mLs Oral Given 10/06/19 1723)  famotidine (PEPCID) IVPB 20 mg premix (0 mg Intravenous Stopped 10/06/19 1758)    ED Course  I have reviewed the triage vital signs and the nursing notes.  Pertinent labs & imaging results that were available during my care of the patient were reviewed by me and considered in my medical decision making (see chart for details).    MDM Rules/Calculators/A&P                         Additional history obtained  from: 1. Nursing notes from this visit. 2. Family, mother at bedside. 3. Electronic medical record reviewed.  Patient with visit on 09/05/2019, diagnosis of acute alcoholic gastritis and nausea/vomiting.  She was treated with haloperidol.  Discharged with Pepcid and Zofran. ----- I reviewed and interpreted labs which include: Urinalysis shows ketones, hemoglobin,, some WBCs and bacteria but 6-10 squamous cells.  Patient without urinary symptoms, doubt UTI appears contaminated. Pregnancy test negative, no evidence for ectopic. Lipase within normal limits, doubt pancreatitis. CBC shows a ketosis of 12.3, no anemia.  Suspect leukocytosis secondary to nausea and vomiting.  CMP without emergent electrolyte derangement, AKI, LFT elevations or gap.  Patient was treated with Protonix/Pepcid and GI cocktail.  She was evaluated at 7 PM, resting comfortably laughing with her mother who is at bedside.  She reports complete resolution of symptoms.  On reexamination patient's abdomen remains completely nontender.  Suspect patient symptoms secondary to gastritis today, of note patient denies alcohol use recently but does eat lots of spicy foods and pain may be attributed to her tea and morning supplements.  Patient reports that she does not take the Pepcid as previously prescribed and only uses it when she needs it most of the time.  Will have patient begin taking Pepcid daily as well as Protonix and follow-up with her PCP for reevaluation.  Doubt cholecystitis, pancreatitis, appendicitis, SBO, perforation or other emergent pathologies at this time.  At this time there does not appear to be any evidence of an acute emergency medical condition and the patient appears stable for discharge with appropriate outpatient follow up. Diagnosis was discussed with patient who verbalizes understanding of care plan and is agreeable to discharge. I have discussed return precautions with patient and mother who verbalizes  understanding. Patient encouraged to follow-up with their PCP. All questions answered.  Patient's case discussed with Dr. Silverio Lay who agrees with plan to discharge with follow-up.   Note: Portions of this report may have been transcribed using voice recognition software. Every effort was made to ensure accuracy; however, inadvertent computerized transcription errors may still be present. Final Clinical Impression(s) / ED Diagnoses Final diagnoses:  Gastritis, presence of bleeding unspecified, unspecified chronicity, unspecified gastritis type    Rx / DC Orders ED Discharge Orders         Ordered    famotidine (PEPCID) 20 MG tablet  2 times daily        10/06/19 1905    pantoprazole (PROTONIX) 20 MG tablet  Daily        10/06/19 1905           Elizabeth Palau 10/06/19 1906    Charlynne Pander, MD 10/06/19 [phone]

## 2019-10-06 NOTE — ED Notes (Addendum)
Family With PT

## 2021-03-13 DIAGNOSIS — R21 Rash and other nonspecific skin eruption: Secondary | ICD-10-CM | POA: Diagnosis not present

## 2022-05-09 ENCOUNTER — Emergency Department (HOSPITAL_BASED_OUTPATIENT_CLINIC_OR_DEPARTMENT_OTHER): Payer: 59 | Admitting: Radiology

## 2022-05-09 ENCOUNTER — Other Ambulatory Visit: Payer: Self-pay

## 2022-05-09 ENCOUNTER — Emergency Department (HOSPITAL_BASED_OUTPATIENT_CLINIC_OR_DEPARTMENT_OTHER)
Admission: EM | Admit: 2022-05-09 | Discharge: 2022-05-09 | Disposition: A | Discharge status: Home / Self Care | Payer: 59 | Attending: Emergency Medicine | Admitting: Emergency Medicine

## 2022-05-09 ENCOUNTER — Encounter (HOSPITAL_BASED_OUTPATIENT_CLINIC_OR_DEPARTMENT_OTHER): Payer: Self-pay | Admitting: *Deleted

## 2022-05-09 DIAGNOSIS — R002 Palpitations: Secondary | ICD-10-CM

## 2022-05-09 DIAGNOSIS — R7309 Other abnormal glucose: Secondary | ICD-10-CM | POA: Diagnosis not present

## 2022-05-09 LAB — CBC
HCT: 39.2 % (ref 36.0–46.0)
Hemoglobin: 13.1 g/dL (ref 12.0–15.0)
MCH: 31.7 pg (ref 26.0–34.0)
MCHC: 33.4 g/dL (ref 30.0–36.0)
MCV: 94.9 fL (ref 80.0–100.0)
Platelets: 182 10*3/uL (ref 150–400)
RBC: 4.13 MIL/uL (ref 3.87–5.11)
RDW: 13.7 % (ref 11.5–15.5)
WBC: 7.2 10*3/uL (ref 4.0–10.5)
nRBC: 0 % (ref 0.0–0.2)

## 2022-05-09 LAB — CBG MONITORING, ED
Glucose-Capillary: 69 mg/dL — ABNORMAL LOW (ref 70–99)
Glucose-Capillary: 116 mg/dL — ABNORMAL HIGH (ref 70–99)

## 2022-05-09 LAB — BASIC METABOLIC PANEL
Anion gap: 19 — ABNORMAL HIGH (ref 5–15)
BUN: 9 mg/dL (ref 6–20)
CO2: 19 mmol/L — ABNORMAL LOW (ref 22–32)
Calcium: 9.5 mg/dL (ref 8.9–10.3)
Chloride: 96 mmol/L — ABNORMAL LOW (ref 98–111)
Creatinine, Ser: 0.5 mg/dL (ref 0.44–1.00)
GFR, Estimated: >60 mL/min (ref >60)
Glucose, Bld: 65 mg/dL — ABNORMAL LOW (ref 70–99)
Potassium: 3.8 mmol/L (ref 3.5–5.1)
Sodium: 134 mmol/L — ABNORMAL LOW (ref 135–145)

## 2022-05-09 LAB — TROPONIN I (HIGH SENSITIVITY): Troponin I (High Sensitivity): <2 ng/L (ref <18)

## 2022-05-09 LAB — PREGNANCY, URINE: Preg Test, Ur: NEGATIVE

## 2022-05-09 NOTE — ED Notes (Signed)
Pt CBG 67, given 4oz juice. Le PA made aware

## 2022-05-09 NOTE — Discharge Instructions (Addendum)
Your workup including labs, imaging and EKG were reassuring today.  Please drink plenty of fluid, get some rest. Take tylenol/ibuprofen for pain. I recommend close follow-up with PCP for reevaluation.  Please do not hesitate to return to emergency department if worrisome signs symptoms we discussed become apparent.

## 2022-05-09 NOTE — ED Notes (Signed)
RN reviewed discharge instructions with pt. Pt verbalized understanding and had no further questions. VSS upon discharge.  

## 2022-05-09 NOTE — ED Provider Notes (Signed)
[place] EMERGENCY DEPARTMENT AT [facility] Provider Note   CSN: [id number] Arrival date & time: 05/09/22  2035     History  Chief Complaint  Patient presents with   Medication Reaction   Palpitations    Kayla Hubbard is a 27 y.o. female past med history of GERD presents today for evaluation of palpitation.  Patient stated she took Excedrin for headache and after her headache went away she felt like her heart was beating fast.  States it lasted for about 2 hours.  States she is feeling better but still feels like her heart is skipping faster than her chest.  She denies any chest pain, shortness of breath, fever, nausea, vomiting, headache, lightheadedness, dizziness, bowel change, urinary symptoms, rash.  Palpitations     Past Medical History:  Diagnosis Date   Gastritis    GERD (gastroesophageal reflux disease)    History reviewed. No pertinent surgical history.   Home Medications Prior to Admission medications   Medication Sig Start Date End Date Taking? Authorizing Provider  famotidine (PEPCID) 20 MG tablet Take 1 tablet (20 mg total) by mouth 2 (two) times daily. 10/06/19   Harlene Salts A, PA-C  pantoprazole (PROTONIX) 20 MG tablet Take 1 tablet (20 mg total) by mouth daily. 10/06/19   Harlene Salts A, PA-C  omeprazole (PRILOSEC) 20 MG capsule Take 1 capsule (20 mg total) by mouth daily. Patient not taking: Reported on 08/02/2016 01/12/14 10/06/19  Ward, Layla Maw, DO  promethazine (PHENERGAN) 25 MG tablet Take 1 tablet (25 mg total) by mouth every 6 (six) hours as needed for nausea or vomiting. Patient not taking: Reported on 08/02/2016 01/12/14 10/06/19  Ward, Layla Maw, DO      Allergies    Patient has no known allergies.    Review of Systems   Review of Systems  Cardiovascular:  Positive for palpitations.    Physical Exam Updated Vital Signs BP (!) 118/94 (BP Location: Right Arm)   Pulse 73   Temp 98.6 F (37 C) (Oral)   Resp 15   SpO2 100%   Physical Exam Vitals and nursing note reviewed.  Constitutional:      Appearance: Normal appearance.  HENT:     Head: Normocephalic and atraumatic.     Mouth/Throat:     Mouth: Mucous membranes are moist.  Eyes:     General: No scleral icterus. Cardiovascular:     Rate and Rhythm: Normal rate and regular rhythm.     Pulses: Normal pulses.     Heart sounds: Normal heart sounds.  Pulmonary:     Effort: Pulmonary effort is normal.     Breath sounds: Normal breath sounds.  Abdominal:     General: Abdomen is flat.     Palpations: Abdomen is soft.     Tenderness: There is no abdominal tenderness.  Musculoskeletal:        General: No deformity.  Skin:    General: Skin is warm.     Findings: No rash.  Neurological:     General: No focal deficit present.     Mental Status: She is alert.  Psychiatric:        Mood and Affect: Mood normal.     ED Results / Procedures / Treatments   Labs (all labs ordered are listed, but only abnormal results are displayed) Labs Reviewed  BASIC METABOLIC PANEL - Abnormal; Notable for the following components:      Result Value   Sodium 134 (*)  Chloride 96 (*)    CO2 19 (*)    Glucose, Bld 65 (*)    Anion gap 19 (*)    All other components within normal limits  CBG MONITORING, ED - Abnormal; Notable for the following components:   Glucose-Capillary 69 (*)    All other components within normal limits  CBG MONITORING, ED - Abnormal; Notable for the following components:   Glucose-Capillary 116 (*)    All other components within normal limits  CBC  PREGNANCY, URINE  TROPONIN I (HIGH SENSITIVITY)  TROPONIN I (HIGH SENSITIVITY)    EKG None  Radiology DG Chest 2 View  Result Date: 05/09/2022 CLINICAL DATA:  Palpitation. EXAM: CHEST - 2 VIEW COMPARISON:  Chest radiograph dated 02/04/2006. FINDINGS: The heart size and mediastinal contours are within normal limits. Both lungs are clear. The visualized skeletal structures are unremarkable.  IMPRESSION: No active cardiopulmonary disease. Electronically Signed   By: Elgie Collard M.D.   On: 05/09/2022 21:22    Procedures Procedures    Medications Ordered in ED Medications - No data to display  ED Course/ Medical Decision Making/ A&P                             Medical Decision Making Amount and/or Complexity of Data Reviewed Labs: ordered. Radiology: ordered.   This patient presents to the ED for palpitation, this involves an extensive number of treatment options, and is a complaint that carries with a high risk of complications and morbidity.  The differential diagnosis includes ACS, pericarditis, PE, pneumothorax, pneumonia, dissection.  This is not an exhaustive list.  Lab tests: I ordered and personally interpreted labs.  The pertinent results include: WBC unremarkable. Hbg unremarkable. Platelets unremarkable. Electrolytes unremarkable. BUN, creatinine unremarkable.  Troponin negative.  Imaging studies: I ordered imaging studies. I personally reviewed, interpreted imaging and agree with the radiologist's interpretations. The results include: Chest x-ray negative.  Problem list/ ED course/ Critical interventions/ Medical management: HPI: See above Vital signs within normal range and stable throughout visit. Laboratory/imaging studies significant for: See above. On physical examination, patient is afebrile and appears in no acute distress.  Patient presents with palpitation earlier today that lasted about 2 hours. Exam without evidence of volume overload so doubt heart failure. EKG without signs of active ischemia. Given the timing of pain to ER presentation, single troponin was negative so doubt NSTEMI. Presentation not consistent with acute PE (PERC negative), pneumothorax (not visualized on chest xr), thoracic aortic dissection, pericarditis, tamponade, pneumonia (no infectious symptoms, clear chest xr), myocarditis (no recent illness, neg trop).  11:30  PM Patient denies any pain at this point.  Heart rate in the 70s on the monitor. HEART score: 0 so plan to  discharge patient home with PCP follow-up.  I have reviewed the patient home medicines and have made adjustments as needed.  Cardiac monitoring/EKG: The patient was maintained on a cardiac monitor.  I personally reviewed and interpreted the cardiac monitor which showed an underlying rhythm of: sinus rhythm.  Additional history obtained: External records from outside source obtained and reviewed including: Chart review including previous notes, labs, imaging.  Consultations obtained:  Disposition Continued outpatient therapy. Follow-up with PCP recommended for reevaluation of symptoms. Treatment plan discussed with patient.  Pt acknowledged understanding was agreeable to the plan. Worrisome signs and symptoms were discussed with patient, and patient acknowledged understanding to return to the ED if they noticed these signs and symptoms. Patient  was stable upon discharge.   This chart was dictated using voice recognition software.  Despite best efforts to proofread,  errors can occur which can change the documentation meaning.          Final Clinical Impression(s) / ED Diagnoses Final diagnoses:  Palpitation    Rx / DC Orders ED Discharge Orders     None         Jeanelle Malling, [place] 05/09/22 2330    Tegeler, Canary Brim, MD 05/09/22 [phone]

## 2022-05-09 NOTE — ED Triage Notes (Signed)
Pt states that she took a Excedrin for a headache and after her headache went away she felt like her heart was beating fast.  This lasted for almost 2 hours.  Pt states that it is feeling better now but still feels like her heart is beating harder.  No CP or sob with this.

## 2023-08-24 DIAGNOSIS — R42 Dizziness and giddiness: Secondary | ICD-10-CM | POA: Diagnosis not present

## 2023-08-24 NOTE — Progress Notes (Signed)
 Patient ID: Kayla Hubbard is a 28 y.o. female.  Allergies[1]  Problem List[2]   Chief Complaint  Patient presents with  . Dizziness    Woke up feeling this way, everytime she tries to stand up or walk it feels like the room is spinning. Happened for a little while yesterday as well and again a few weeks ago as well. Face feels tingly (started in the lobby) she states this feeling is on and off. Pt reports that she has not eaten anything, just some water and Pedialyte      History of Present Illness: History of Present Illness This is a patient with a history of daily alcohol consumption presenting with lightheadedness/feeling off balanced.  The patient reports feeling better overall but experienced a sudden onset of lightheadedness upon waking this morning, which has persisted throughout the day. The lightheadedness/dizziness was so severe that she felt unsteady and nearly fell while descending stairs. She also felt faint upon arrival at the clinic. She has been experiencing these episodes more frequently since the beginning of the month, with a brief episode occurring yesterday. She has not lost consciousness during these episodes. She experienced mild nausea this morning, which she attributes to possible dehydration from alcohol consumption the previous night. She consumed a bottle of wine and three shots of tequila last night. She does not report any abdominal pain or nausea. She does not report any thoughts of self-harm or harm to others. She does not report any other physical symptoms besides the dizziness. She does not report any recent cough, sore throat, or runny nose. She occasionally experiences hand tremors. She maintains hydration by drinking water and Pedialyte, consuming at least half a gallon of water daily. Her diet typically consists of two meals a day, often skipping breakfast. She admits to not eating anything today. She does not consume much caffeine. The patient drinks alcohol  daily, usually in the afternoon or evening, and regularly uses marijuana. She has attempted to quit drinking in the past, resulting in night sweats and irritability, but has not experienced severe withdrawal symptoms requiring hospitalization.  She had a mild headache upon waking this morning, which resolved after taking BC powder.  SOCIAL HISTORY The patient drinks alcohol every day, typically starting in the afternoon and continuing into the night. The patient smokes marijuana regularly.    Review of Systems: All others reviewed and negative except as listed above.  Objective: Vitals:   08/24/23 1616 08/24/23 1749  BP: 112/80 108/78  BP Location: Left arm   Patient Position: Sitting   Pulse: 84 88  Resp: 16   Temp: 97.6 F (36.4 C)   TempSrc: Oral   SpO2: 100%   Weight: 55.3 kg (122 lb)   Height: 1.6 m (5' 3)      Physical Exam: Physical Exam Vitals and nursing note reviewed.  Constitutional:      General: She is not in acute distress.    Appearance: Normal appearance. She is not toxic-appearing.  HENT:     Head: Normocephalic and atraumatic.     Comments: No nystagmus    Nose: Nose normal.     Mouth/Throat:     Mouth: Mucous membranes are moist.   Eyes:     Extraocular Movements: Extraocular movements intact.     Conjunctiva/sclera: Conjunctivae normal.     Pupils: Pupils are equal, round, and reactive to light.    Cardiovascular:     Rate and Rhythm: Normal rate and regular rhythm.  Pulmonary:  Effort: Pulmonary effort is normal.     Breath sounds: Normal breath sounds. No wheezing, rhonchi or rales.  Abdominal:     General: Abdomen is flat.   Musculoskeletal:        General: Normal range of motion.     Cervical back: Normal range of motion.   Skin:    General: Skin is warm and dry.     Findings: No rash.   Neurological:     General: No focal deficit present.     Mental Status: She is alert and oriented to person, place, and time. Mental status  is at baseline.     GCS: GCS eye subscore is 4. GCS verbal subscore is 5. GCS motor subscore is 6.     Cranial Nerves: No cranial nerve deficit.     Motor: Tremor present. No weakness.     Coordination: Romberg sign negative.     Gait: Gait is intact. Gait normal.     Comments: Very faint hand tremor noted  Psychiatric:        Mood and Affect: Mood normal.        Behavior: Behavior normal.      Results for orders placed or performed in visit on 08/24/23  POC Urinalysis Auto without Microscopic  Result Value Ref Range   Color, Urine Dark Yellow (A) Yellow   Clarity, Urine Turbid (A) Clear   Glucose, Urine Negative Negative mg/dL   Bilirubin, Urine Small (A) Negative   Ketones, Urine 40 (A) Negative mg/dL   Specific Gravity, Urine 1.020 1.010, 1.015, 1.020, 1.025   Blood, Urine Moderate (A) Negative   pH, Urine 7.5 5.0, 5.5, 6.0, 6.5, 7.0, 7.5, 8.0   Protein, Urine 30 (A) Negative mg/dL   Urobilinogen, Urine 0.2 <2.0 mg/dL   Nitrite, Urine Negative Negative   Leukocyte Esterase, Urine Negative Negative   Kit/Device Lot # [id number]    Kit/Device Expiration Date 63026   POC HCG Qualitative, Urine  Result Value Ref Range   HCG, Urine, POC Negative Negative   Internal Control Acceptable    Kit/Device Lot # [id number]    Kit/Device Expiration Date 83126   POC Glucose  Result Value Ref Range   Glucose, POC 79 70 - 99 mg/dL     Assessment: Kayla Hubbard was seen today for dizziness.  Diagnoses and all orders for this visit:  Dizziness -     ECG 12 lead -     POC Urinalysis Auto without Microscopic -     POC HCG Qualitative, Urine -     Cancel: CBC with Differential -     Cancel: Comprehensive Metabolic Panel -     sodium chloride  (bolus) 0.9 % bolus 1,000 mL -     CBC with Differential -     Comprehensive Metabolic Panel  Lightheadedness  Alcohol use  Other orders -     POC Glucose     Assessment & Plan 1. Dizziness: Acute. Presenting to urgent care today with complaint  of lightheadedness/dizziness that began this morning.  States he feels like she could pass out and that she feels off balance when ambulating.  Also has some mild nausea.  States that it is started to improve throughout the day however her mother recommended she come to be seen because she does not want to his history of vertigo.  She denies any room spinning dizziness.  On arrival patient is afebrile, nontachycardic and nontachypneic.  She appears to be in no acute distress at this  time.  Given complaint of dizziness EKG, CBG, orthostatics obtained. - EKG with sinus arrhythmia; no acute ischemic changes - CBG 79 -  Orthostatic Vitals:   08/24/23 1616 08/24/23 1617 08/24/23 1620 08/24/23 1621  Patient Position: Sitting Lying Sitting Standing  Orthostatic BP:  106/80 115/78 106/76  Orthostatic Pulse:  73 82 82  - Orthostatics negative however question possibility of mild dehydration causing symptoms.  Patient does report that she went out last night and drink 1 bottle of wine as well as at least 3 shots of tequila.  She states that she does typically drink daily however admits to drinking a little more than usual last night and typically does not drink a bottle of water prior to going out.  - CIWA score currently a 6; pt denies hx of alcohol withdrawal and given symptoms occurred this morning with waking I have less suspicion for same; she does appear somewhat anxious when discussing her alcohol use which could be causing an increase in her score  - Denies any SI, HI, or AVH and do not feel pt requires ED eval at this time. She also denies any concerning feature including severe headache, vision changes, speech changes, unilateral weakness or numbness. Did feel she was having some facial tingling in the waiting room however this has resolved and sensation intact throughout face.  - Will plan for IV fluids and reassessment.  Patient given snack as she has not been eating anything.  CBG 79 which could be  contributing to her dizziness/lightheadedness. - Added CBC and CMP to labs given IV is being started - U/A collected without signs of infection. There is moderate amount of ketones in the urine but normal specific gravity   5:54 PM On reevaluation after IV fluids and a snack pt reports significant improvement in her symptoms. She no longer appears tremulous; could have been related to not eating today. Recommend close PCP follow up. Encouraged increased oral intake/food intake. Strict ED precautions discussed with pt as well. She is in agreement with plan.     Plan: Home Care    Symptomatic management discussed.  Patient was given verbal and written instructions on symptoms that necessitate return to the UC/ED, and instructed to f/u w/ UC or PCP if not improving in expected timeframe.   Patient/parent has been instructed on RX/OTC medications, dosages, side effects, and possible interactions as associated with each diagnosis in my impression and plan above.   Patient education (verbal/handout) given on diagnosis, pathophysiology, treatment of diagnosis, side effects of medication use for treatment, restrictions while taking medication, supportives measures such as staying hydrated.   Red Flags associated with diagnosis/es were reviewed and patient instructed on action plan if red flags develop.   They have been instructed that if symptoms worsen or red flags develop they should return to Urgent Care, go to the nearest ED, or activate EMS/911.     Patient and/or parent/guardian (if applicable) agreed with plan and voiced understanding.  No barriers to adherence perceived by myself.  During this patient encounter if the patient presented with respiratory complaints and any concern for possible COVID, the patient was wearing a mask. In these cases, throughout the encounter, I was wearing at least a surgical mask as well myself.     Portions of this note may have been dictated using Dragon  dictation software/hardware and may contain grammatical or spelling errors.   Electronically signed by Morgan Browns  Sun 08/24/2023 5:55 PM         [  1] No Known Allergies [2] There is no problem list on file for this patient.

## 2023-08-26 DIAGNOSIS — I498 Other specified cardiac arrhythmias: Secondary | ICD-10-CM | POA: Diagnosis not present

## 2023-08-29 ENCOUNTER — Emergency Department (HOSPITAL_BASED_OUTPATIENT_CLINIC_OR_DEPARTMENT_OTHER)
Admission: EM | Admit: 2023-08-29 | Discharge: 2023-08-29 | Disposition: A | Discharge status: Home / Self Care | Attending: Emergency Medicine | Admitting: Emergency Medicine

## 2023-08-29 ENCOUNTER — Encounter (HOSPITAL_BASED_OUTPATIENT_CLINIC_OR_DEPARTMENT_OTHER): Payer: Self-pay | Admitting: Emergency Medicine

## 2023-08-29 DIAGNOSIS — K2920 Alcoholic gastritis without bleeding: Secondary | ICD-10-CM | POA: Diagnosis not present

## 2023-08-29 DIAGNOSIS — R000 Tachycardia, unspecified: Secondary | ICD-10-CM | POA: Insufficient documentation

## 2023-08-29 DIAGNOSIS — F17290 Nicotine dependence, other tobacco product, uncomplicated: Secondary | ICD-10-CM | POA: Diagnosis not present

## 2023-08-29 DIAGNOSIS — R1013 Epigastric pain: Secondary | ICD-10-CM | POA: Diagnosis present

## 2023-08-29 DIAGNOSIS — F1010 Alcohol abuse, uncomplicated: Secondary | ICD-10-CM | POA: Diagnosis not present

## 2023-08-29 DIAGNOSIS — F1020 Alcohol dependence, uncomplicated: Secondary | ICD-10-CM | POA: Insufficient documentation

## 2023-08-29 DIAGNOSIS — F1090 Alcohol use, unspecified, uncomplicated: Secondary | ICD-10-CM

## 2023-08-29 LAB — CBC
HCT: 40.6 % (ref 36.0–46.0)
Hemoglobin: 13.8 g/dL (ref 12.0–15.0)
MCH: 32.6 pg (ref 26.0–34.0)
MCHC: 34 g/dL (ref 30.0–36.0)
MCV: 96 fL (ref 80.0–100.0)
Platelets: 162 K/uL (ref 150–400)
RBC: 4.23 MIL/uL (ref 3.87–5.11)
RDW: 13.6 % (ref 11.5–15.5)
WBC: 8.6 K/uL (ref 4.0–10.5)
nRBC: 0 % (ref 0.0–0.2)

## 2023-08-29 LAB — URINALYSIS, ROUTINE W REFLEX MICROSCOPIC
Bilirubin Urine: NEGATIVE
Glucose, UA: NEGATIVE mg/dL
Ketones, ur: >80 mg/dL — AB
Leukocytes,Ua: NEGATIVE
Nitrite: NEGATIVE
Protein, ur: 30 mg/dL — AB
Specific Gravity, Urine: 1.029 (ref 1.005–1.030)
pH: 5.5 (ref 5.0–8.0)

## 2023-08-29 LAB — COMPREHENSIVE METABOLIC PANEL WITH GFR
ALT: 41 U/L (ref 0–44)
AST: 72 U/L — ABNORMAL HIGH (ref 15–41)
Albumin: 5.1 g/dL — ABNORMAL HIGH (ref 3.5–5.0)
Alkaline Phosphatase: 50 U/L (ref 38–126)
Anion gap: 30 — ABNORMAL HIGH (ref 5–15)
BUN: 10 mg/dL (ref 6–20)
CO2: 11 mmol/L — ABNORMAL LOW (ref 22–32)
Calcium: 9.8 mg/dL (ref 8.9–10.3)
Chloride: 95 mmol/L — ABNORMAL LOW (ref 98–111)
Creatinine, Ser: 0.64 mg/dL (ref 0.44–1.00)
GFR, Estimated: >60 mL/min (ref >60)
Glucose, Bld: 90 mg/dL (ref 70–99)
Potassium: 3.7 mmol/L (ref 3.5–5.1)
Sodium: 136 mmol/L (ref 135–145)
Total Bilirubin: 1.2 mg/dL (ref 0.0–1.2)
Total Protein: 7.8 g/dL (ref 6.5–8.1)

## 2023-08-29 LAB — BASIC METABOLIC PANEL WITH GFR
Anion gap: 22 — ABNORMAL HIGH (ref 5–15)
BUN: 7 mg/dL (ref 6–20)
CO2: 17 mmol/L — ABNORMAL LOW (ref 22–32)
Calcium: 9.2 mg/dL (ref 8.9–10.3)
Chloride: 97 mmol/L — ABNORMAL LOW (ref 98–111)
Creatinine, Ser: 0.7 mg/dL (ref 0.44–1.00)
GFR, Estimated: >60 mL/min (ref >60)
Glucose, Bld: 202 mg/dL — ABNORMAL HIGH (ref 70–99)
Potassium: 4.5 mmol/L (ref 3.5–5.1)
Sodium: 135 mmol/L (ref 135–145)

## 2023-08-29 LAB — PREGNANCY, URINE: Preg Test, Ur: NEGATIVE

## 2023-08-29 LAB — LIPASE, BLOOD: Lipase: 19 U/L (ref 11–51)

## 2023-08-29 MED ORDER — ONDANSETRON HCL 4 MG PO TABS: 4.0000 mg | ORAL_TABLET | Freq: Three times a day (TID) | ORAL | 0 refills | Status: DC | PRN

## 2023-08-29 MED ORDER — PANTOPRAZOLE SODIUM 40 MG IV SOLR
40.0000 mg | Freq: Once | INTRAVENOUS | Status: AC
  Administered 2023-08-29: 40 mg via INTRAVENOUS
  Filled 2023-08-29: qty 10

## 2023-08-29 MED ORDER — DROPERIDOL 2.5 MG/ML IJ SOLN
2.5000 mg | Freq: Once | INTRAMUSCULAR | Status: AC
  Administered 2023-08-29: 2.5 mg via INTRAVENOUS
  Filled 2023-08-29: qty 2

## 2023-08-29 MED ORDER — METOCLOPRAMIDE HCL 5 MG/ML IJ SOLN: 10.0000 mg | Freq: Once | INTRAMUSCULAR | Status: DC

## 2023-08-29 MED ORDER — SODIUM CHLORIDE 0.9 % IV BOLUS
1000.0000 mL | Freq: Once | INTRAVENOUS | Status: AC
  Administered 2023-08-29: 1000 mL via INTRAVENOUS

## 2023-08-29 MED ORDER — DIAZEPAM 5 MG/ML IJ SOLN
5.0000 mg | Freq: Once | INTRAMUSCULAR | Status: AC
  Administered 2023-08-29: 5 mg via INTRAVENOUS
  Filled 2023-08-29: qty 2

## 2023-08-29 MED ORDER — NALTREXONE HCL 50 MG PO TABS: 50.0000 mg | ORAL_TABLET | Freq: Every day | ORAL | 0 refills | Status: AC

## 2023-08-29 MED ORDER — ONDANSETRON HCL 4 MG/2ML IJ SOLN
4.0000 mg | Freq: Once | INTRAMUSCULAR | Status: AC
  Administered 2023-08-29: 4 mg via INTRAVENOUS
  Filled 2023-08-29: qty 2

## 2023-08-29 MED ORDER — DEXTROSE 5 % IV BOLUS
500.0000 mL | Freq: Once | INTRAVENOUS | Status: AC
  Administered 2023-08-29: 500 mL via INTRAVENOUS

## 2023-08-29 MED ORDER — ONDANSETRON HCL 4 MG/2ML IJ SOLN
4.0000 mg | Freq: Once | INTRAMUSCULAR | Status: AC | PRN
  Administered 2023-08-29: 4 mg via INTRAVENOUS
  Filled 2023-08-29: qty 2

## 2023-08-29 MED ORDER — LACTATED RINGERS IV BOLUS
1000.0000 mL | Freq: Once | INTRAVENOUS | Status: AC
  Administered 2023-08-29: 1000 mL via INTRAVENOUS

## 2023-08-29 NOTE — ED Notes (Addendum)
 Pt reports having emesis x3 after receiving medications. MD made aware.

## 2023-08-29 NOTE — ED Triage Notes (Signed)
 Pt caox4 c/o abd pain and N/V since last night after drinking too much.

## 2023-08-29 NOTE — ED Notes (Signed)
 Pt given saltine crackers and cranberry juice. Pt had emesis episode x1 and still feels nauseas. MD made aware.

## 2023-08-29 NOTE — ED Notes (Signed)
 ED Provider at bedside.

## 2023-08-29 NOTE — Discharge Instructions (Addendum)
 While you are in the emergency room, you received medications to help out with your nausea.  I have sent a prescription for Zofran  to your pharmacy.  You may take this every 6-8 hours as needed for nausea.  I have also sent you some naltrexone  which is a medication that can be helpful with presenting alcohol cravings.  I recommend reducing your alcohol consumption.  Return to the emergency room if you develop worsening pain in your abdomen, or are unable to eat or drink.

## 2023-08-29 NOTE — ED Provider Notes (Signed)
 [place] EMERGENCY DEPARTMENT AT [facility] Provider Note  CSN: [id number] Arrival date & time: 08/29/23 1400  Chief Complaint(s) Emesis  HPI Kayla Hubbard is a 28 y.o. female epigastric pain, discomfort.  Patient is a daily drinker, drink a bottle of wine last evening.  She is here today with her mother at bedside.  Denies any diarrhea.  No fever, no chills.   Past Medical History Past Medical History:  Diagnosis Date   Gastritis    GERD (gastroesophageal reflux disease)    There are no active problems to display for this patient.  Home Medication(s) Prior to Admission medications   Medication Sig Start Date End Date Taking? Authorizing Provider  naltrexone  (DEPADE) 50 MG tablet Take 1 tablet (50 mg total) by mouth daily. 08/29/23 09/28/23 Yes Mannie Pac T, DO  ondansetron  (ZOFRAN ) 4 MG tablet Take 1 tablet (4 mg total) by mouth every 8 (eight) hours as needed for nausea or vomiting. 08/29/23  Yes Mannie Pac T, DO  famotidine  (PEPCID ) 20 MG tablet Take 1 tablet (20 mg total) by mouth 2 (two) times daily. 10/06/19   Donah Riis A, PA-C  pantoprazole  (PROTONIX ) 20 MG tablet Take 1 tablet (20 mg total) by mouth daily. 10/06/19   Donah Riis A, PA-C  omeprazole  (PRILOSEC) 20 MG capsule Take 1 capsule (20 mg total) by mouth daily. Patient not taking: Reported on 08/02/2016 01/12/14 10/06/19  Ward, Josette SAILOR, DO  promethazine  (PHENERGAN ) 25 MG tablet Take 1 tablet (25 mg total) by mouth every 6 (six) hours as needed for nausea or vomiting. Patient not taking: Reported on 08/02/2016 01/12/14 10/06/19  Ward, Josette SAILOR, DO                                                                                                                                    Past Surgical History History reviewed. No pertinent surgical history. Family History History reviewed. No pertinent family history.  Social History Social History   Tobacco Use   Smoking status: Every Day    Types:  Cigars   Smokeless tobacco: Never  Vaping Use   Vaping status: Never Used  Substance Use Topics   Alcohol use: Yes    Comment: pt states she has been drinking everyday and gets sweats during night if she does not drink and also gets irritable if she does not drink   Drug use: No   Allergies Patient has no known allergies.  Review of Systems Review of Systems  Physical Exam Vital Signs  I have reviewed the triage vital signs BP (!) 138/101   Pulse (!) 109   Temp 98.9 F (37.2 C) (Oral)   Resp 15   Ht 5' 4 (1.626 m)   Wt 55.3 kg   LMP 08/11/2023 (Approximate)   SpO2 100%   BMI 20.94 kg/m   Physical Exam Vitals and nursing note reviewed.  HENT:     Head:  Normocephalic and atraumatic.  Cardiovascular:     Rate and Rhythm: Tachycardia present.  Pulmonary:     Effort: Pulmonary effort is normal.  Abdominal:     General: Abdomen is flat. There is no distension.     Palpations: Abdomen is soft.     Tenderness: There is no abdominal tenderness. There is no guarding.  Musculoskeletal:        General: Normal range of motion.     Cervical back: Normal range of motion.  Neurological:     General: No focal deficit present.     Mental Status: She is alert.     ED Results and Treatments Labs (all labs ordered are listed, but only abnormal results are displayed) Labs Reviewed  URINALYSIS, ROUTINE W REFLEX MICROSCOPIC - Abnormal; Notable for the following components:      Result Value   APPearance HAZY (*)    Hgb urine dipstick MODERATE (*)    Ketones, ur >80 (*)    Protein, ur 30 (*)    Bacteria, UA FEW (*)    All other components within normal limits  COMPREHENSIVE METABOLIC PANEL WITH GFR - Abnormal; Notable for the following components:   Chloride 95 (*)    CO2 11 (*)    Albumin 5.1 (*)    AST 72 (*)    Anion gap 30 (*)    All other components within normal limits  BASIC METABOLIC PANEL WITH GFR - Abnormal; Notable for the following components:   Chloride 97  (*)    CO2 17 (*)    Glucose, Bld 202 (*)    Anion gap 22 (*)    All other components within normal limits  CBC  PREGNANCY, URINE  LIPASE, BLOOD                                                                                                                          Radiology No results found.  Pertinent labs & imaging results that were available during my care of the patient were reviewed by me and considered in my medical decision making (see MDM for details).  Medications Ordered in ED Medications  ondansetron  (ZOFRAN ) injection 4 mg (has no administration in time range)  ondansetron  (ZOFRAN ) injection 4 mg (4 mg Intravenous Given 08/29/23 1442)  lactated ringers  bolus 1,000 mL (0 mLs Intravenous Stopped 08/29/23 1652)  diazepam  (VALIUM ) injection 5 mg (5 mg Intravenous Given 08/29/23 1533)  ondansetron  (ZOFRAN ) injection 4 mg (4 mg Intravenous Given 08/29/23 1532)  pantoprazole  (PROTONIX ) injection 40 mg (40 mg Intravenous Given 08/29/23 1532)  droperidol  (INAPSINE ) 2.5 MG/ML injection 2.5 mg (2.5 mg Intravenous Given 08/29/23 1729)  dextrose  5 % bolus 500 mL (0 mLs Intravenous Stopped 08/29/23 2044)  sodium chloride  0.9 % bolus 1,000 mL (0 mLs Intravenous Stopped 08/29/23 2120)  diazepam  (VALIUM ) injection 5 mg (5 mg Intravenous Given 08/29/23 2234)  Procedures Procedures  (including critical care time)  Medical Decision Making / ED Course   This patient presents to the ED for concern of vomiting and abdominal pain, this involves an extensive number of treatment options, and is a complaint that carries with it a high risk of complications and morbidity.  The differential diagnosis includes Tritus, pancreatitis, alcoholic gastritis, less likely esophageal perforation, less likely obstruction, less likely acute intra-abdominal infection.  MDM: Patient  overall well-appearing.  Her abdomen is soft.  Does have tachycardia, listed is 120s when she arrived, in the 1 teens in the room.  Likely does have some dehydration.  Will check labs in the patient, fluid resuscitate, symptomatically treat.  Do not see any evidence of withdrawal yet in this patient, however suspect she has high potential to begin experiencing withdrawal symptoms.  Will also treat her with some benzodiazepine.  Reassessment 10:30 PM-anion gap downtrending.  Patient tolerating p.o.  She is appropriate for discharge.  Will discharge with antiemetics and naltrexone  for alcohol use disorder.   Additional history obtained: -Additional history obtained from mother at bedside -External records from outside source obtained and reviewed including: Chart review including previous notes, labs, imaging, consultation notes   Lab Tests: -I ordered, reviewed, and interpreted labs.   The pertinent results include:   Labs Reviewed  URINALYSIS, ROUTINE W REFLEX MICROSCOPIC - Abnormal; Notable for the following components:      Result Value   APPearance HAZY (*)    Hgb urine dipstick MODERATE (*)    Ketones, ur >80 (*)    Protein, ur 30 (*)    Bacteria, UA FEW (*)    All other components within normal limits  COMPREHENSIVE METABOLIC PANEL WITH GFR - Abnormal; Notable for the following components:   Chloride 95 (*)    CO2 11 (*)    Albumin 5.1 (*)    AST 72 (*)    Anion gap 30 (*)    All other components within normal limits  BASIC METABOLIC PANEL WITH GFR - Abnormal; Notable for the following components:   Chloride 97 (*)    CO2 17 (*)    Glucose, Bld 202 (*)    Anion gap 22 (*)    All other components within normal limits  CBC  PREGNANCY, URINE  LIPASE, BLOOD     Medicines ordered and prescription drug management: Meds ordered this encounter  Medications   ondansetron  (ZOFRAN ) injection 4 mg   lactated ringers  bolus 1,000 mL   diazepam  (VALIUM ) injection 5 mg    ondansetron  (ZOFRAN ) injection 4 mg   pantoprazole  (PROTONIX ) injection 40 mg   DISCONTD: metoCLOPramide  (REGLAN ) injection 10 mg   droperidol  (INAPSINE ) 2.5 MG/ML injection 2.5 mg   dextrose  5 % bolus 500 mL   sodium chloride  0.9 % bolus 1,000 mL   diazepam  (VALIUM ) injection 5 mg   ondansetron  (ZOFRAN ) injection 4 mg   ondansetron  (ZOFRAN ) 4 MG tablet    Sig: Take 1 tablet (4 mg total) by mouth every 8 (eight) hours as needed for nausea or vomiting.    Dispense:  20 tablet    Refill:  0   naltrexone  (DEPADE) 50 MG tablet    Sig: Take 1 tablet (50 mg total) by mouth daily.    Dispense:  30 tablet    Refill:  0    -I have reviewed the patients home medicines and have made adjustments as needed    Cardiac Monitoring: The patient was maintained on a cardiac monitor.  I personally viewed and interpreted the cardiac monitored which showed an underlying rhythm of: Normal sinus rhythm  Social Determinants of Health:  Factors impacting patients care include: Alcohol abuse   Reevaluation: After the interventions noted above, I reevaluated the patient and found that they have :improved  Co morbidities that complicate the patient evaluation  Past Medical History:  Diagnosis Date   Gastritis    GERD (gastroesophageal reflux disease)       Dispostion: I considered admission for this patient, however with her improvement and improving labs, she is appropriate for discharge.     Final Clinical Impression(s) / ED Diagnoses Final diagnoses:  Acute alcoholic gastritis without hemorrhage  Alcohol use disorder     @PCDICTATION @    Mannie Pac T, DO 08/29/23 2245

## 2023-12-17 ENCOUNTER — Other Ambulatory Visit: Payer: Self-pay

## 2023-12-17 ENCOUNTER — Emergency Department (HOSPITAL_BASED_OUTPATIENT_CLINIC_OR_DEPARTMENT_OTHER)
Admission: EM | Admit: 2023-12-17 | Discharge: 2023-12-17 | Disposition: A | Discharge status: Home / Self Care | Attending: Emergency Medicine | Admitting: Emergency Medicine

## 2023-12-17 ENCOUNTER — Encounter (HOSPITAL_BASED_OUTPATIENT_CLINIC_OR_DEPARTMENT_OTHER): Payer: Self-pay | Admitting: Emergency Medicine

## 2023-12-17 DIAGNOSIS — F1090 Alcohol use, unspecified, uncomplicated: Secondary | ICD-10-CM | POA: Insufficient documentation

## 2023-12-17 DIAGNOSIS — R112 Nausea with vomiting, unspecified: Secondary | ICD-10-CM | POA: Insufficient documentation

## 2023-12-17 DIAGNOSIS — K2920 Alcoholic gastritis without bleeding: Secondary | ICD-10-CM

## 2023-12-17 DIAGNOSIS — R1013 Epigastric pain: Secondary | ICD-10-CM | POA: Insufficient documentation

## 2023-12-17 LAB — CBC WITH DIFFERENTIAL/PLATELET
Abs Immature Granulocytes: 0.01 K/uL (ref 0.00–0.07)
Basophils Absolute: 0 K/uL (ref 0.0–0.1)
Basophils Relative: 1 %
Eosinophils Absolute: 0 K/uL (ref 0.0–0.5)
Eosinophils Relative: 0 %
HCT: 35.4 % — ABNORMAL LOW (ref 36.0–46.0)
Hemoglobin: 12.1 g/dL (ref 12.0–15.0)
Immature Granulocytes: 0 %
Lymphocytes Relative: 7 %
Lymphs Abs: 0.4 K/uL — ABNORMAL LOW (ref 0.7–4.0)
MCH: 32.4 pg (ref 26.0–34.0)
MCHC: 34.2 g/dL (ref 30.0–36.0)
MCV: 94.7 fL (ref 80.0–100.0)
Monocytes Absolute: 0.2 K/uL (ref 0.1–1.0)
Monocytes Relative: 4 %
Neutro Abs: 5.7 K/uL (ref 1.7–7.7)
Neutrophils Relative %: 88 %
Platelets: 175 K/uL (ref 150–400)
RBC: 3.74 MIL/uL — ABNORMAL LOW (ref 3.87–5.11)
RDW: 14.2 % (ref 11.5–15.5)
WBC: 6.4 K/uL (ref 4.0–10.5)
nRBC: 0 % (ref 0.0–0.2)

## 2023-12-17 LAB — COMPREHENSIVE METABOLIC PANEL WITH GFR
ALT: 38 U/L (ref 0–44)
AST: 69 U/L — ABNORMAL HIGH (ref 15–41)
Albumin: 4.9 g/dL (ref 3.5–5.0)
Alkaline Phosphatase: 54 U/L (ref 38–126)
Anion gap: 23 — ABNORMAL HIGH (ref 5–15)
BUN: 5 mg/dL — ABNORMAL LOW (ref 6–20)
CO2: 20 mmol/L — ABNORMAL LOW (ref 22–32)
Calcium: 9.7 mg/dL (ref 8.9–10.3)
Chloride: 94 mmol/L — ABNORMAL LOW (ref 98–111)
Creatinine, Ser: 0.53 mg/dL (ref 0.44–1.00)
GFR, Estimated: >60 mL/min (ref >60)
Glucose, Bld: 129 mg/dL — ABNORMAL HIGH (ref 70–99)
Potassium: 3.4 mmol/L — ABNORMAL LOW (ref 3.5–5.1)
Sodium: 137 mmol/L (ref 135–145)
Total Bilirubin: 1.1 mg/dL (ref 0.0–1.2)
Total Protein: 7.4 g/dL (ref 6.5–8.1)

## 2023-12-17 LAB — LIPASE, BLOOD: Lipase: 24 U/L (ref 11–51)

## 2023-12-17 MED ORDER — ONDANSETRON HCL 4 MG/2ML IJ SOLN
4.0000 mg | Freq: Once | INTRAMUSCULAR | Status: AC
  Administered 2023-12-17: 4 mg via INTRAVENOUS
  Filled 2023-12-17: qty 2

## 2023-12-17 MED ORDER — KETOROLAC TROMETHAMINE 30 MG/ML IJ SOLN
30.0000 mg | Freq: Once | INTRAMUSCULAR | Status: AC
  Administered 2023-12-17: 30 mg via INTRAVENOUS
  Filled 2023-12-17: qty 1

## 2023-12-17 MED ORDER — SODIUM CHLORIDE 0.9 % IV BOLUS
1000.0000 mL | Freq: Once | INTRAVENOUS | Status: AC
  Administered 2023-12-17: 1000 mL via INTRAVENOUS

## 2023-12-17 MED ORDER — PANTOPRAZOLE SODIUM 40 MG IV SOLR
40.0000 mg | Freq: Once | INTRAVENOUS | Status: AC
  Administered 2023-12-17: 40 mg via INTRAVENOUS
  Filled 2023-12-17: qty 10

## 2023-12-17 NOTE — ED Notes (Signed)
 Pt tolerating gingerale so far.

## 2023-12-17 NOTE — ED Notes (Signed)
 Pt states she feels much better, was able to tolerate all ginger ale

## 2023-12-17 NOTE — ED Provider Notes (Signed)
  EMERGENCY DEPARTMENT AT [facility] Provider Note   CSN: [id number] Arrival date & time: 12/17/23  0111     Patient presents with: Emesis   Kayla Hubbard is a 28 y.o. female.   Patient is a 28 year old female with history of alcohol abuse.  Patient presenting today with complaints of epigastric pain, nausea, and vomiting.  She reports consuming excess quantities of alcohol yesterday evening.  She woke up nauseated, then went to work.  Throughout the day she was unable to eat because of feeling nauseated.  She began vomiting this evening and complaining of upper abdominal discomfort.  She has had similar episodes in the past related to alcoholic gastritis.       Prior to Admission medications   Medication Sig Start Date End Date Taking? Authorizing Provider  famotidine  (PEPCID ) 20 MG tablet Take 1 tablet (20 mg total) by mouth 2 (two) times daily. 10/06/19   Donah Penne LABOR, PA-C  ondansetron  (ZOFRAN ) 4 MG tablet Take 1 tablet (4 mg total) by mouth every 8 (eight) hours as needed for nausea or vomiting. 08/29/23   Mannie Pac T, DO  pantoprazole  (PROTONIX ) 20 MG tablet Take 1 tablet (20 mg total) by mouth daily. 10/06/19   Donah Penne LABOR, PA-C  omeprazole  (PRILOSEC) 20 MG capsule Take 1 capsule (20 mg total) by mouth daily. Patient not taking: Reported on 08/02/2016 01/12/14 10/06/19  Ward, Josette SAILOR, DO  promethazine  (PHENERGAN ) 25 MG tablet Take 1 tablet (25 mg total) by mouth every 6 (six) hours as needed for nausea or vomiting. Patient not taking: Reported on 08/02/2016 01/12/14 10/06/19  Ward, Josette SAILOR, DO    Allergies: Patient has no known allergies.    Review of Systems  All other systems reviewed and are negative.   Updated Vital Signs BP (!) 135/92   Pulse (!) 104   Temp 99.3 F (37.4 C) (Oral)   Resp 18   Wt 51.7 kg   LMP 12/10/2023 (Exact Date)   SpO2 100%   BMI 19.57 kg/m   Physical Exam Vitals and nursing note reviewed.   Constitutional:      General: She is not in acute distress.    Appearance: She is well-developed. She is not diaphoretic.  HENT:     Head: Normocephalic and atraumatic.  Cardiovascular:     Rate and Rhythm: Normal rate and regular rhythm.     Heart sounds: No murmur heard.    No friction rub. No gallop.  Pulmonary:     Effort: Pulmonary effort is normal. No respiratory distress.     Breath sounds: Normal breath sounds. No wheezing.  Abdominal:     General: Bowel sounds are normal. There is no distension.     Palpations: Abdomen is soft.     Tenderness: There is abdominal tenderness. There is no guarding or rebound.     Comments: There is mild tenderness in the epigastric region.  Musculoskeletal:        General: Normal range of motion.     Cervical back: Normal range of motion and neck supple.  Skin:    General: Skin is warm and dry.  Neurological:     General: No focal deficit present.     Mental Status: She is alert and oriented to person, place, and time.     (all labs ordered are listed, but only abnormal results are displayed) Labs Reviewed  COMPREHENSIVE METABOLIC PANEL WITH GFR  LIPASE, BLOOD  CBC WITH DIFFERENTIAL/PLATELET  EKG: None  Radiology: No results found.   Procedures   Medications Ordered in the ED  sodium chloride  0.9 % bolus 1,000 mL (has no administration in time range)  ondansetron  (ZOFRAN ) injection 4 mg (has no administration in time range)  ketorolac  (TORADOL ) 30 MG/ML injection 30 mg (has no administration in time range)  pantoprazole  (PROTONIX ) injection 40 mg (has no administration in time range)                                    Medical Decision Making Amount and/or Complexity of Data Reviewed Labs: ordered.  Risk Prescription drug management.   Patient with history of alcohol-related gastritis presenting with epigastric pain, nausea, and vomiting.  Patient arrives with stable vital signs and is afebrile.  Physical  examination reveals mild epigastric tenderness, but is otherwise benign.  Laboratory studies obtained including CBC, CMP, and lipase.  She has no leukocytosis and lipase is normal.  CO2 is 20, but laboratory studies otherwise basically unremarkable.  Patient has been hydrated with normal saline and given medication for nausea including Zofran  and Protonix  and seems to be feeling much better.  She will be discharged with Zofran , Prilosec, and follow-up as needed.  She was advised to refrain from alcohol consumption as this seems to be the cause of her issues.     Final diagnoses:  None    ED Discharge Orders     None          Geroldine Berg, MD 12/17/23 [phone]

## 2023-12-17 NOTE — ED Notes (Addendum)
 SABRA

## 2023-12-17 NOTE — ED Triage Notes (Signed)
 Nausea and stomach cramps since 1500 12/09. Emesis since. Suspects too much EtOH night before and not eating after that. Low grade fever.

## 2023-12-17 NOTE — Discharge Instructions (Signed)
 Begin taking Prilosec 20 mg twice daily for the next 2 weeks, then once daily thereafter.  Refrain from excessive alcohol use.  Follow-up with primary doctor if symptoms persist, and return to the ER if symptoms significantly worsen or change.

## 2023-12-17 NOTE — ED Notes (Signed)
Patient verbalizes understanding of discharge instructions. Opportunity for questioning and answers were provided. Armband removed by staff, pt discharged from ED. Ambulated out to lobby with mother  

## 2023-12-18 ENCOUNTER — Emergency Department (HOSPITAL_BASED_OUTPATIENT_CLINIC_OR_DEPARTMENT_OTHER)
Admission: EM | Admit: 2023-12-18 | Discharge: 2023-12-18 | Disposition: A | Discharge status: Home / Self Care | Attending: Emergency Medicine | Admitting: Emergency Medicine

## 2023-12-18 ENCOUNTER — Encounter (HOSPITAL_BASED_OUTPATIENT_CLINIC_OR_DEPARTMENT_OTHER): Payer: Self-pay

## 2023-12-18 ENCOUNTER — Other Ambulatory Visit: Payer: Self-pay

## 2023-12-18 DIAGNOSIS — E860 Dehydration: Secondary | ICD-10-CM | POA: Insufficient documentation

## 2023-12-18 DIAGNOSIS — K2920 Alcoholic gastritis without bleeding: Secondary | ICD-10-CM | POA: Diagnosis not present

## 2023-12-18 DIAGNOSIS — E876 Hypokalemia: Secondary | ICD-10-CM | POA: Diagnosis not present

## 2023-12-18 DIAGNOSIS — R1010 Upper abdominal pain, unspecified: Secondary | ICD-10-CM | POA: Diagnosis present

## 2023-12-18 LAB — COMPREHENSIVE METABOLIC PANEL WITH GFR
ALT: 29 U/L (ref 0–44)
AST: 36 U/L (ref 15–41)
Albumin: 4.5 g/dL (ref 3.5–5.0)
Alkaline Phosphatase: 45 U/L (ref 38–126)
Anion gap: 19 — ABNORMAL HIGH (ref 5–15)
BUN: 6 mg/dL (ref 6–20)
CO2: 23 mmol/L (ref 22–32)
Calcium: 9.7 mg/dL (ref 8.9–10.3)
Chloride: 95 mmol/L — ABNORMAL LOW (ref 98–111)
Creatinine, Ser: 0.49 mg/dL (ref 0.44–1.00)
GFR, Estimated: >60 mL/min (ref >60)
Glucose, Bld: 93 mg/dL (ref 70–99)
Potassium: 3.2 mmol/L — ABNORMAL LOW (ref 3.5–5.1)
Sodium: 137 mmol/L (ref 135–145)
Total Bilirubin: 0.7 mg/dL (ref 0.0–1.2)
Total Protein: 7.1 g/dL (ref 6.5–8.1)

## 2023-12-18 LAB — URINALYSIS, ROUTINE W REFLEX MICROSCOPIC
Bilirubin Urine: NEGATIVE
Glucose, UA: NEGATIVE mg/dL
Hgb urine dipstick: NEGATIVE
Ketones, ur: >80 mg/dL — AB
Nitrite: NEGATIVE
Protein, ur: 30 mg/dL — AB
Specific Gravity, Urine: 1.031 — ABNORMAL HIGH (ref 1.005–1.030)
pH: 6.5 (ref 5.0–8.0)

## 2023-12-18 LAB — CBC WITH DIFFERENTIAL/PLATELET
Abs Immature Granulocytes: 0.02 K/uL (ref 0.00–0.07)
Basophils Absolute: 0 K/uL (ref 0.0–0.1)
Basophils Relative: 0 %
Eosinophils Absolute: 0 K/uL (ref 0.0–0.5)
Eosinophils Relative: 0 %
HCT: 36 % (ref 36.0–46.0)
Hemoglobin: 12.3 g/dL (ref 12.0–15.0)
Immature Granulocytes: 0 %
Lymphocytes Relative: 15 %
Lymphs Abs: 0.8 K/uL (ref 0.7–4.0)
MCH: 32.1 pg (ref 26.0–34.0)
MCHC: 34.2 g/dL (ref 30.0–36.0)
MCV: 94 fL (ref 80.0–100.0)
Monocytes Absolute: 0.5 K/uL (ref 0.1–1.0)
Monocytes Relative: 9 %
Neutro Abs: 4 K/uL (ref 1.7–7.7)
Neutrophils Relative %: 76 %
Platelets: 150 K/uL (ref 150–400)
RBC: 3.83 MIL/uL — ABNORMAL LOW (ref 3.87–5.11)
RDW: 13.8 % (ref 11.5–15.5)
WBC: 5.3 K/uL (ref 4.0–10.5)
nRBC: 0 % (ref 0.0–0.2)

## 2023-12-18 LAB — LIPASE, BLOOD: Lipase: 40 U/L (ref 11–51)

## 2023-12-18 LAB — MAGNESIUM: Magnesium: 1.8 mg/dL (ref 1.7–2.4)

## 2023-12-18 LAB — HCG, SERUM, QUALITATIVE: Preg, Serum: NEGATIVE

## 2023-12-18 MED ORDER — SODIUM CHLORIDE 0.9 % IV SOLN
25.0000 mg | Freq: Once | INTRAVENOUS | Status: AC
  Administered 2023-12-18: 25 mg via INTRAVENOUS
  Filled 2023-12-18: qty 1

## 2023-12-18 MED ORDER — METOCLOPRAMIDE HCL 5 MG/ML IJ SOLN
10.0000 mg | Freq: Once | INTRAMUSCULAR | Status: AC
  Administered 2023-12-18: 10 mg via INTRAVENOUS
  Filled 2023-12-18: qty 2

## 2023-12-18 MED ORDER — SODIUM CHLORIDE 0.9 % IV BOLUS
1000.0000 mL | Freq: Once | INTRAVENOUS | Status: AC
  Administered 2023-12-18: 1000 mL via INTRAVENOUS

## 2023-12-18 MED ORDER — OMEPRAZOLE 40 MG PO CPDR: 40.0000 mg | DELAYED_RELEASE_CAPSULE | Freq: Every day | ORAL | 0 refills | Status: DC

## 2023-12-18 MED ORDER — FAMOTIDINE IN NACL 20-0.9 MG/50ML-% IV SOLN
20.0000 mg | Freq: Once | INTRAVENOUS | Status: AC
  Administered 2023-12-18: 20 mg via INTRAVENOUS
  Filled 2023-12-18: qty 50

## 2023-12-18 MED ORDER — PROMETHAZINE HCL 25 MG/ML IJ SOLN
INTRAMUSCULAR | Status: AC
  Filled 2023-12-18: qty 1

## 2023-12-18 MED ORDER — ONDANSETRON HCL 4 MG/2ML IJ SOLN
4.0000 mg | Freq: Once | INTRAMUSCULAR | Status: AC
  Administered 2023-12-18: 4 mg via INTRAVENOUS
  Filled 2023-12-18: qty 2

## 2023-12-18 MED ORDER — ONDANSETRON 4 MG PO TBDP: 4.0000 mg | ORAL_TABLET | Freq: Three times a day (TID) | ORAL | 0 refills | Status: DC | PRN

## 2023-12-18 MED ORDER — ALUM & MAG HYDROXIDE-SIMETH 200-200-20 MG/5ML PO SUSP
30.0000 mL | Freq: Once | ORAL | Status: AC
  Administered 2023-12-18: 30 mL via ORAL
  Filled 2023-12-18: qty 30

## 2023-12-18 MED ORDER — DIPHENHYDRAMINE HCL 50 MG/ML IJ SOLN
12.5000 mg | Freq: Once | INTRAMUSCULAR | Status: AC
  Administered 2023-12-18: 12.5 mg via INTRAVENOUS
  Filled 2023-12-18: qty 1

## 2023-12-18 NOTE — ED Provider Notes (Signed)
 [name] EMERGENCY DEPARTMENT AT [facility] Provider Note   CSN: [id number] Arrival date & time: 12/18/23  9198     Patient presents with: Abdominal Pain   Kayla Hubbard is a 28 y.o. female.   Pt is a 28 yo female with pmhx significant for alcoholic gastritis and GERD.  She drank a lot this weekend and has been having upper abd pain since then.  She was seen in the ED yesterday for the same, but was not prescribed any meds for home.  She has had continued intermittent vomiting and upper abd pain, so she came back.  No f/c.  She has vomited some brown stuff.       Prior to Admission medications  Medication Sig Start Date End Date Taking? Authorizing Provider  omeprazole  (PRILOSEC) 40 MG capsule Take 1 capsule (40 mg total) by mouth daily. 12/18/23  Yes Dean Clarity, MD  ondansetron  (ZOFRAN -ODT) 4 MG disintegrating tablet Take 1 tablet (4 mg total) by mouth every 8 (eight) hours as needed for nausea or vomiting. 12/18/23  Yes Dean Clarity, MD  famotidine  (PEPCID ) 20 MG tablet Take 1 tablet (20 mg total) by mouth 2 (two) times daily. 10/06/19   Donah Riis A, PA-C  ondansetron  (ZOFRAN ) 4 MG tablet Take 1 tablet (4 mg total) by mouth every 8 (eight) hours as needed for nausea or vomiting. 08/29/23   Mannie Pac T, DO  pantoprazole  (PROTONIX ) 20 MG tablet Take 1 tablet (20 mg total) by mouth daily. 10/06/19   Donah Riis A, PA-C  promethazine  (PHENERGAN ) 25 MG tablet Take 1 tablet (25 mg total) by mouth every 6 (six) hours as needed for nausea or vomiting. Patient not taking: Reported on 08/02/2016 01/12/14 10/06/19  Ward, Josette SAILOR, DO    Allergies: Patient has no known allergies.    Review of Systems  Gastrointestinal:  Positive for abdominal pain, nausea and vomiting.  All other systems reviewed and are negative.   Updated Vital Signs BP 131/89 (BP Location: Left Arm)   Pulse 74   Temp 98.2 F (36.8 C) (Oral)   Resp 16   Wt 51.7 kg   LMP  12/10/2023 (Exact Date)   SpO2 100%   BMI 19.56 kg/m   Physical Exam Vitals and nursing note reviewed.  Constitutional:      Appearance: She is well-developed.  HENT:     Head: Normocephalic and atraumatic.     Mouth/Throat:     Mouth: Mucous membranes are moist.     Pharynx: Oropharynx is clear.  Eyes:     Extraocular Movements: Extraocular movements intact.     Pupils: Pupils are equal, round, and reactive to light.  Cardiovascular:     Rate and Rhythm: Normal rate and regular rhythm.     Heart sounds: Normal heart sounds.  Abdominal:     General: Abdomen is flat. Bowel sounds are normal.     Palpations: Abdomen is soft.     Tenderness: There is abdominal tenderness in the epigastric area.  Skin:    General: Skin is warm.     Capillary Refill: Capillary refill takes less than 2 seconds.  Neurological:     General: No focal deficit present.     Mental Status: She is alert and oriented to person, place, and time.  Psychiatric:        Mood and Affect: Mood normal.        Behavior: Behavior normal.     (all labs ordered are listed, but only  abnormal results are displayed) Labs Reviewed  CBC WITH DIFFERENTIAL/PLATELET - Abnormal; Notable for the following components:      Result Value   RBC 3.83 (*)    All other components within normal limits  COMPREHENSIVE METABOLIC PANEL WITH GFR - Abnormal; Notable for the following components:   Potassium 3.2 (*)    Chloride 95 (*)    Anion gap 19 (*)    All other components within normal limits  URINALYSIS, ROUTINE W REFLEX MICROSCOPIC - Abnormal; Notable for the following components:   Specific Gravity, Urine 1.031 (*)    Ketones, ur >80 (*)    Protein, ur 30 (*)    Leukocytes,Ua SMALL (*)    Bacteria, UA RARE (*)    All other components within normal limits  LIPASE, BLOOD  HCG, SERUM, QUALITATIVE  MAGNESIUM    EKG: None  Radiology: No results found.   Procedures   Medications Ordered in the ED  promethazine   (PHENERGAN ) 25 MG/ML injection (has no administration in time range)  ondansetron  (ZOFRAN ) injection 4 mg (4 mg Intravenous Given 12/18/23 0855)  sodium chloride  0.9 % bolus 1,000 mL (0 mLs Intravenous Stopped 12/18/23 0955)  famotidine  (PEPCID ) IVPB 20 mg premix (0 mg Intravenous Stopped 12/18/23 0928)  alum & mag hydroxide-simeth (MAALOX/MYLANTA) 200-200-20 MG/5ML suspension 30 mL (30 mLs Oral Given 12/18/23 0859)  promethazine  (PHENERGAN ) 25 mg in sodium chloride  0.9 % 50 mL IVPB (0 mg Intravenous Stopped 12/18/23 1034)  sodium chloride  0.9 % bolus 1,000 mL (0 mLs Intravenous Stopped 12/18/23 1318)  metoCLOPramide  (REGLAN ) injection 10 mg (10 mg Intravenous Given 12/18/23 1156)  diphenhydrAMINE (BENADRYL) injection 12.5 mg (12.5 mg Intravenous Given 12/18/23 1156)                                    Medical Decision Making Amount and/or Complexity of Data Reviewed Labs: ordered.  Risk OTC drugs. Prescription drug management.   This patient presents to the ED for concern of abd pain, this involves an extensive number of treatment options, and is a complaint that carries with it a high risk of complications and morbidity.  The differential diagnosis includes gastritis, pancreatitis, pregnancy, infection   Co morbidities that complicate the patient evaluation  alcoholic gastritis and GERD   Additional history obtained:  Additional history obtained from epic chart review External records from outside source obtained and reviewed including mom   Lab Tests:  I Ordered, and personally interpreted labs.  The pertinent results include:  cbc nl, cmp nl other than k low at 3.2; mg nl; preg neg; ua + ketones   Medicines ordered and prescription drug management:  I ordered medication including ivfs/zofran /prilosec/phenergan /reglan  for sx  Reevaluation of the patient after these medicines showed that the patient improved I have reviewed the patients home medicines and have made  adjustments as needed   Problem List / ED Course:  N/v/dehydration:  pt is feeling much better and is tolerating po.  Sx started after an alcohol binge.  She is encouraged ot avoid alcohol.  She is stable for d/c.  Return if worse.    Reevaluation:  After the interventions noted above, I reevaluated the patient and found that they have :improved   Social Determinants of Health:  Lives at home   Dispostion:  After consideration of the diagnostic results and the patients response to treatment, I feel that the patent would benefit from discharge with  outpatient f/u.       Final diagnoses:  Dehydration  Acute alcoholic gastritis, presence of bleeding unspecified  Hypokalemia    ED Discharge Orders          Ordered    ondansetron  (ZOFRAN -ODT) 4 MG disintegrating tablet  Every 8 hours PRN        12/18/23 1013    omeprazole  (PRILOSEC) 40 MG capsule  Daily        12/18/23 1013               Dean Clarity, MD 12/18/23 1358

## 2023-12-18 NOTE — ED Triage Notes (Signed)
 Presents to ED with c/o N/V and abdominal pain. Was seen yesterday for same. Has been able to tolerate liquids at home. States not as bad as yesterday, but still doesn't feel well

## 2023-12-18 NOTE — Discharge Instructions (Addendum)
 It is very important that you avoid alcohol.  High potassium foods:  Focus on dark leafy greens (spinach, Swiss chard), legumes (black beans, lentils), nuts & seeds (pumpkin, sunflower), avocados, potatoes, whole grains (brown rice), and fish (salmon), as these offer significant amounts of both essential minerals for energy and muscle function.   Vegetables & Legumes Spinach & Swiss Chard: Excellent sources of both; cooked spinach has loads of potassium and good magnesium.  Avocado: Creamy fruit packed with potassium and magnesium.  Potatoes & Sweet Potatoes: High in potassium (especially with skin) and magnesium.  Black Beans & Lentils: Great plant-based protein and fiber, rich in both minerals.  Edamame & Lima Beans: Good sources, especially lima beans for potassium.  Nuts & Seeds Pumpkin Seeds & Sunflower Seeds: Nutrient powerhouses, loaded with magnesium and potassium. Chia Seeds: Offer both minerals in small servings.  Fruits Bananas: Famous for potassium, also contain magnesium. Dried Apricots: Concentrated source of potassium.  Grains & Proteins Brown Rice & Whole Grains: Contribute to magnesium and potassium intake. Salmon: A source of potassium and other minerals.

## 2023-12-18 NOTE — ED Notes (Signed)
 Pt was given ginger ale and graham crackers for PO challenge by MD. Pt called RN to room for increase in nausea and dry heaves. MD notified.

## 2024-02-12 ENCOUNTER — Other Ambulatory Visit: Payer: Self-pay

## 2024-02-12 ENCOUNTER — Emergency Department (HOSPITAL_BASED_OUTPATIENT_CLINIC_OR_DEPARTMENT_OTHER)
Admission: EM | Admit: 2024-02-12 | Discharge: 2024-02-12 | Disposition: A | Discharge status: Home / Self Care | Payer: Self-pay | Source: Home / Self Care | Attending: Emergency Medicine | Admitting: Emergency Medicine

## 2024-02-12 ENCOUNTER — Encounter (HOSPITAL_BASED_OUTPATIENT_CLINIC_OR_DEPARTMENT_OTHER): Payer: Self-pay

## 2024-02-12 DIAGNOSIS — K2900 Acute gastritis without bleeding: Secondary | ICD-10-CM

## 2024-02-12 LAB — COMPREHENSIVE METABOLIC PANEL WITH GFR
ALT: 27 U/L (ref 0–44)
AST: 44 U/L — ABNORMAL HIGH (ref 15–41)
Albumin: 5.1 g/dL — ABNORMAL HIGH (ref 3.5–5.0)
Alkaline Phosphatase: 58 U/L (ref 38–126)
Anion gap: 22 — ABNORMAL HIGH (ref 5–15)
BUN: 7 mg/dL (ref 6–20)
CO2: 21 mmol/L — ABNORMAL LOW (ref 22–32)
Calcium: 10 mg/dL (ref 8.9–10.3)
Chloride: 96 mmol/L — ABNORMAL LOW (ref 98–111)
Creatinine, Ser: 0.62 mg/dL (ref 0.44–1.00)
GFR, Estimated: >60 mL/min
Glucose, Bld: 138 mg/dL — ABNORMAL HIGH (ref 70–99)
Potassium: 3.7 mmol/L (ref 3.5–5.1)
Sodium: 140 mmol/L (ref 135–145)
Total Bilirubin: 0.8 mg/dL (ref 0.0–1.2)
Total Protein: 7.8 g/dL (ref 6.5–8.1)

## 2024-02-12 LAB — CBC
HCT: 41 % (ref 36.0–46.0)
Hemoglobin: 14 g/dL (ref 12.0–15.0)
MCH: 33.2 pg (ref 26.0–34.0)
MCHC: 34.1 g/dL (ref 30.0–36.0)
MCV: 97.2 fL (ref 80.0–100.0)
Platelets: 221 10*3/uL (ref 150–400)
RBC: 4.22 MIL/uL (ref 3.87–5.11)
RDW: 14.1 % (ref 11.5–15.5)
WBC: 7.2 10*3/uL (ref 4.0–10.5)
nRBC: 0 % (ref 0.0–0.2)

## 2024-02-12 LAB — HCG, SERUM, QUALITATIVE: Preg, Serum: NEGATIVE

## 2024-02-12 LAB — LIPASE, BLOOD: Lipase: 30 U/L (ref 11–51)

## 2024-02-12 MED ORDER — FAMOTIDINE IN NACL 20-0.9 MG/50ML-% IV SOLN
20.0000 mg | Freq: Once | INTRAVENOUS | Status: AC
  Administered 2024-02-12: 20 mg via INTRAVENOUS
  Filled 2024-02-12: qty 50

## 2024-02-12 MED ORDER — ONDANSETRON HCL 4 MG/2ML IJ SOLN
4.0000 mg | Freq: Once | INTRAMUSCULAR | Status: AC | PRN
  Administered 2024-02-12: 4 mg via INTRAVENOUS
  Filled 2024-02-12: qty 2

## 2024-02-12 MED ORDER — FAMOTIDINE 20 MG PO TABS: 20.0000 mg | ORAL_TABLET | Freq: Two times a day (BID) | ORAL | 0 refills | Status: DC

## 2024-02-12 MED ORDER — ALUM & MAG HYDROXIDE-SIMETH 400-400-40 MG/5ML PO SUSP: 15.0000 mL | Freq: Four times a day (QID) | ORAL | 0 refills | Status: AC | PRN

## 2024-02-12 MED ORDER — LACTATED RINGERS IV BOLUS
1000.0000 mL | Freq: Once | INTRAVENOUS | Status: AC
  Administered 2024-02-12: 1000 mL via INTRAVENOUS

## 2024-02-12 MED ORDER — PANTOPRAZOLE SODIUM 40 MG IV SOLR
40.0000 mg | Freq: Once | INTRAVENOUS | Status: AC
  Administered 2024-02-12: 40 mg via INTRAVENOUS
  Filled 2024-02-12: qty 10

## 2024-02-12 MED ORDER — FAMOTIDINE 20 MG PO TABS: 20.0000 mg | ORAL_TABLET | Freq: Two times a day (BID) | ORAL | 0 refills | Status: AC

## 2024-02-12 MED ORDER — FENTANYL CITRATE (PF) 50 MCG/ML IJ SOSY
50.0000 ug | PREFILLED_SYRINGE | Freq: Once | INTRAMUSCULAR | Status: AC
  Administered 2024-02-12: 50 ug via INTRAVENOUS
  Filled 2024-02-12: qty 1

## 2024-02-12 MED ORDER — PANTOPRAZOLE SODIUM 20 MG PO TBEC: 20.0000 mg | DELAYED_RELEASE_TABLET | Freq: Two times a day (BID) | ORAL | 0 refills | Status: AC

## 2024-02-12 MED ORDER — ONDANSETRON HCL 4 MG PO TABS: 4.0000 mg | ORAL_TABLET | Freq: Three times a day (TID) | ORAL | 0 refills | Status: AC | PRN

## 2024-02-12 NOTE — ED Provider Notes (Signed)
 " [facility] Provider Note   CSN: [id number] Arrival date & time: 02/12/24  9946     Patient presents with: Emesis   Kayla Hubbard is a 29 y.o. female.   29 year old female who presents ER today with persistent nausea vomiting and abdominal pain.  Patient states that she drinks pretty much every day and was binge drinking this weekend.  She did throw up this weekend.  She states she probably threw up once or twice on Monday and Tuesday but then today she threw up in the morning and then this evening threw up multiple times as well had some red specks in it.  Decreased appetite throughout the day today.  Patient expresses little bit of interest in alcohol cessation especially after discussing this will keep happening and likely get worse if she does not quit can lead to cancer, pancreatitis, liver failure.  Denies any fevers, diarrhea, sick contacts, suspicious food intake.    Emesis      Prior to Admission medications  Medication Sig Start Date End Date Taking? Authorizing Provider  alum & mag hydroxide-simeth (MAALOX PLUS) 400-400-40 MG/5ML suspension Take 15 mLs by mouth every 6 (six) hours as needed for indigestion. 02/12/24  Yes Coryn Mosso, Selinda, MD  pantoprazole  (PROTONIX ) 20 MG tablet Take 1 tablet (20 mg total) by mouth 2 (two) times daily. 02/12/24 02/26/24 Yes Ayahna Solazzo, Selinda, MD  famotidine  (PEPCID ) 20 MG tablet Take 1 tablet (20 mg total) by mouth 2 (two) times daily. 02/12/24 02/19/24  Samina Weekes, Selinda, MD  ondansetron  (ZOFRAN ) 4 MG tablet Take 1 tablet (4 mg total) by mouth every 8 (eight) hours as needed for nausea or vomiting. 02/12/24   Sophiagrace Benbrook, Selinda, MD  promethazine  (PHENERGAN ) 25 MG tablet Take 1 tablet (25 mg total) by mouth every 6 (six) hours as needed for nausea or vomiting. Patient not taking: Reported on 08/02/2016 01/12/14 10/06/19  Ward, Josette SAILOR, DO    Allergies: Patient has no known allergies.    Review of Systems   Gastrointestinal:  Positive for vomiting.    Updated Vital Signs BP 100/76   Pulse 74   Temp 98 F (36.7 C) (Oral)   Resp 20   LMP 02/04/2024 (Approximate)   SpO2 99%   Physical Exam Vitals and nursing note reviewed.     (all labs ordered are listed, but only abnormal results are displayed) Labs Reviewed  COMPREHENSIVE METABOLIC PANEL WITH GFR - Abnormal; Notable for the following components:      Result Value   Chloride 96 (*)    CO2 21 (*)    Glucose, Bld 138 (*)    Albumin 5.1 (*)    AST 44 (*)    Anion gap 22 (*)    All other components within normal limits  LIPASE, BLOOD  CBC  HCG, SERUM, QUALITATIVE  URINALYSIS, ROUTINE W REFLEX MICROSCOPIC    EKG: None  Radiology: No results found.   Procedures   Medications Ordered in the ED  ondansetron  (ZOFRAN ) injection 4 mg (4 mg Intravenous Given 02/12/24 0131)  pantoprazole  (PROTONIX ) injection 40 mg (40 mg Intravenous Given 02/12/24 0134)  lactated ringers  bolus 1,000 mL (0 mLs Intravenous Stopped 02/12/24 0238)  famotidine  (PEPCID ) IVPB 20 mg premix (0 mg Intravenous Stopped 02/12/24 0157)  fentaNYL  (SUBLIMAZE ) injection 50 mcg (50 mcg Intravenous Given 02/12/24 0157)  lactated ringers  bolus 1,000 mL (1,000 mLs Intravenous New Bag/Given 02/12/24 0157)  Medical Decision Making Amount and/or Complexity of Data Reviewed Labs: ordered.  Risk OTC drugs. Prescription drug management.   Overall patient appears well.  Will treat her symptomatically and check labs.  Patient is not sure she is ready to quit drinking yet so we will hold off on Librium.  She is interested in possibly some counseling or other resources so those will be printed out on her discharge.  She is tolerating p.o.  Vital signs are stable within normal limits.     Final diagnoses:  Acute gastritis without hemorrhage, unspecified gastritis type    ED Discharge Orders          Ordered    pantoprazole  (PROTONIX )  20 MG tablet  2 times daily        02/12/24 0306    famotidine  (PEPCID ) 20 MG tablet  2 times daily,   Status:  Discontinued        02/12/24 0306    alum & mag hydroxide-simeth (MAALOX PLUS) 400-400-40 MG/5ML suspension  Every 6 hours PRN        02/12/24 0306    ondansetron  (ZOFRAN ) 4 MG tablet  Every 8 hours PRN        02/12/24 0306    famotidine  (PEPCID ) 20 MG tablet  2 times daily        02/12/24 0306               Nettie Cromwell, Selinda, MD 02/12/24 [phone]  "

## 2024-02-12 NOTE — ED Triage Notes (Signed)
 Pt presents via POV c/o emesis and abd pain. Reports binge drinking over the weekend. Reports hx gastritis and GERD. Also reports being a daily ETOH drinker.

## 2024-02-12 NOTE — ED Notes (Signed)
 PO challenge done and passed with room temp gingerale
